# Patient Record
Sex: Female | Born: 1995 | Race: White | Hispanic: No | Marital: Married | Smoking: Never smoker
Health system: Southern US, Community
[De-identification: ages and names within clinical notes are randomized; demographics above are authoritative.]

## PROBLEM LIST (undated history)

## (undated) DIAGNOSIS — Z86718 Personal history of other venous thrombosis and embolism: Secondary | ICD-10-CM

## (undated) DIAGNOSIS — I498 Other specified cardiac arrhythmias: Secondary | ICD-10-CM

## (undated) DIAGNOSIS — I2699 Other pulmonary embolism without acute cor pulmonale: Secondary | ICD-10-CM

## (undated) DIAGNOSIS — Q51810 Arcuate uterus: Secondary | ICD-10-CM

## (undated) DIAGNOSIS — K831 Obstruction of bile duct: Secondary | ICD-10-CM

## (undated) DIAGNOSIS — G90A Postural orthostatic tachycardia syndrome (POTS): Secondary | ICD-10-CM

## (undated) DIAGNOSIS — O26649 Intrahepatic cholestasis of pregnancy, unspecified trimester: Secondary | ICD-10-CM

## (undated) DIAGNOSIS — Q231 Congenital insufficiency of aortic valve: Secondary | ICD-10-CM

## (undated) DIAGNOSIS — Q2381 Bicuspid aortic valve: Secondary | ICD-10-CM

## (undated) DIAGNOSIS — E538 Deficiency of other specified B group vitamins: Secondary | ICD-10-CM

## (undated) DIAGNOSIS — F988 Other specified behavioral and emotional disorders with onset usually occurring in childhood and adolescence: Secondary | ICD-10-CM

## (undated) HISTORY — DX: Deficiency of other specified B group vitamins: E53.8

## (undated) HISTORY — PX: WISDOM TOOTH EXTRACTION: SHX21

## (undated) HISTORY — DX: Obstruction of bile duct: K83.1

## (undated) HISTORY — DX: Intrahepatic cholestasis of pregnancy, unspecified trimester: O26.649

## (undated) HISTORY — DX: Bicuspid aortic valve: Q23.81

## (undated) HISTORY — DX: Congenital insufficiency of aortic valve: Q23.1

## (undated) HISTORY — DX: Arcuate uterus: Q51.810

## (undated) HISTORY — PX: TYMPANOSTOMY: SHX2586

## (undated) HISTORY — PX: WRIST SURGERY: SHX841

---
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
## 2014-02-11 DIAGNOSIS — F988 Other specified behavioral and emotional disorders with onset usually occurring in childhood and adolescence: Secondary | ICD-10-CM | POA: Insufficient documentation

## 2015-07-25 DIAGNOSIS — R000 Tachycardia, unspecified: Secondary | ICD-10-CM | POA: Insufficient documentation

## 2016-09-27 DIAGNOSIS — Z86718 Personal history of other venous thrombosis and embolism: Secondary | ICD-10-CM | POA: Insufficient documentation

## 2018-06-25 DIAGNOSIS — G90A Postural orthostatic tachycardia syndrome (POTS): Secondary | ICD-10-CM | POA: Insufficient documentation

## 2018-07-21 ENCOUNTER — Encounter (HOSPITAL_BASED_OUTPATIENT_CLINIC_OR_DEPARTMENT_OTHER): Payer: Self-pay | Admitting: *Deleted

## 2018-07-21 ENCOUNTER — Emergency Department (HOSPITAL_BASED_OUTPATIENT_CLINIC_OR_DEPARTMENT_OTHER)
Admission: EM | Admit: 2018-07-21 | Discharge: 2018-07-21 | Disposition: A | Discharge status: Home / Self Care | Payer: 59 | Attending: Emergency Medicine | Admitting: Emergency Medicine

## 2018-07-21 ENCOUNTER — Other Ambulatory Visit: Payer: Self-pay

## 2018-07-21 DIAGNOSIS — O26899 Other specified pregnancy related conditions, unspecified trimester: Secondary | ICD-10-CM | POA: Insufficient documentation

## 2018-07-21 DIAGNOSIS — Z7901 Long term (current) use of anticoagulants: Secondary | ICD-10-CM | POA: Diagnosis not present

## 2018-07-21 DIAGNOSIS — R0789 Other chest pain: Secondary | ICD-10-CM | POA: Insufficient documentation

## 2018-07-21 DIAGNOSIS — Z79899 Other long term (current) drug therapy: Secondary | ICD-10-CM | POA: Insufficient documentation

## 2018-07-21 HISTORY — DX: Other specified behavioral and emotional disorders with onset usually occurring in childhood and adolescence: F98.8

## 2018-07-21 HISTORY — DX: Postural orthostatic tachycardia syndrome (POTS): G90.A

## 2018-07-21 HISTORY — DX: Other specified cardiac arrhythmias: I49.8

## 2018-07-21 HISTORY — DX: Personal history of other venous thrombosis and embolism: Z86.718

## 2018-07-21 LAB — CBC WITH DIFFERENTIAL/PLATELET
Abs Immature Granulocytes: 0.01 10*3/uL (ref 0.00–0.07)
Basophils Absolute: 0 10*3/uL (ref 0.0–0.1)
Basophils Relative: 0 %
Eosinophils Absolute: 0.1 10*3/uL (ref 0.0–0.5)
Eosinophils Relative: 1 %
HCT: 36.7 % (ref 36.0–46.0)
Hemoglobin: 12.2 g/dL (ref 12.0–15.0)
Immature Granulocytes: 0 %
Lymphocytes Relative: 24 %
Lymphs Abs: 1.4 10*3/uL (ref 0.7–4.0)
MCH: 31.3 pg (ref 26.0–34.0)
MCHC: 33.2 g/dL (ref 30.0–36.0)
MCV: 94.1 fL (ref 80.0–100.0)
Monocytes Absolute: 0.6 10*3/uL (ref 0.1–1.0)
Monocytes Relative: 11 %
Neutro Abs: 3.6 10*3/uL (ref 1.7–7.7)
Neutrophils Relative %: 64 %
Platelets: 273 10*3/uL (ref 150–400)
RBC: 3.9 MIL/uL (ref 3.87–5.11)
RDW: 12.7 % (ref 11.5–15.5)
WBC: 5.7 10*3/uL (ref 4.0–10.5)
nRBC: 0 % (ref 0.0–0.2)

## 2018-07-21 LAB — COMPREHENSIVE METABOLIC PANEL
ALT: 46 U/L — ABNORMAL HIGH (ref 0–44)
AST: 40 U/L (ref 15–41)
Albumin: 4.1 g/dL (ref 3.5–5.0)
Alkaline Phosphatase: 55 U/L (ref 38–126)
Anion gap: 8 (ref 5–15)
BUN: 10 mg/dL (ref 6–20)
CO2: 22 mmol/L (ref 22–32)
Calcium: 9.1 mg/dL (ref 8.9–10.3)
Chloride: 105 mmol/L (ref 98–111)
Creatinine, Ser: 0.61 mg/dL (ref 0.44–1.00)
GFR calc Af Amer: >60 mL/min (ref >60)
GFR calc non Af Amer: >60 mL/min (ref >60)
Glucose, Bld: 94 mg/dL (ref 70–99)
Potassium: 3.4 mmol/L — ABNORMAL LOW (ref 3.5–5.1)
Sodium: 135 mmol/L (ref 135–145)
Total Bilirubin: 0.4 mg/dL (ref 0.3–1.2)
Total Protein: 6.7 g/dL (ref 6.5–8.1)

## 2018-07-21 LAB — HCG, QUANTITATIVE, PREGNANCY: hCG, Beta Chain, Quant, S: 38945 m[IU]/mL — ABNORMAL HIGH (ref <5)

## 2018-07-21 MED ORDER — ALUM & MAG HYDROXIDE-SIMETH 200-200-20 MG/5ML PO SUSP
30.0000 mL | Freq: Once | ORAL | Status: AC
  Administered 2018-07-21: 30 mL via ORAL
  Filled 2018-07-21: qty 30

## 2018-07-21 NOTE — Discharge Instructions (Addendum)
Your testing here today did not show any significant abnormalities.  Follow-up with your doctor.  Continue to use your Lovenox as this is an appropriate dose to prevent blood clots.

## 2018-07-21 NOTE — ED Provider Notes (Signed)
[facility] EMERGENCY DEPARTMENT Provider Note   CSN: [id number] Arrival date & time: 07/21/18  1944     History   Chief Complaint No chief complaint on file.   HPI Sabrina Riddle is a 23 y.o. female.     HPI Patient presents to the emergency department with chest pressure that started right after eating lunch.  The patient states that it spreads across her entire chest.  The patient states that she does have some shortness of breath when she ambulates.  Patient states she did see her cardiologist earlier today.  The patient states that she is taking her Lovenox as prescribed.  The patient denies  headache,blurred vision, neck pain, fever, cough, weakness, numbness, dizziness, anorexia, edema, abdominal pain, nausea, vomiting, diarrhea, rash, back pain, dysuria, hematemesis, bloody stool, near syncope, or syncope. Past Medical History:  Diagnosis Date  . ADD (attention deficit disorder)   . History of DVT (deep vein thrombosis)   . POTS (postural orthostatic tachycardia syndrome)   . Pulmonary embolism (HCC)     There are no active problems to display for this patient.   History reviewed. No pertinent surgical history.   OB History    Gravida  1   Para      Term      Preterm      AB      Living        SAB      TAB      Ectopic      Multiple      Live Births               Home Medications    Prior to Admission medications   Medication Sig Start Date End Date Taking? Authorizing Provider  enoxaparin (LOVENOX) 60 MG/0.6ML injection Inject 50 mg into the skin. 08/09/13  Yes [provider]  Prenatal Vit-Fe Fumarate-FA (PRENATAL VITAMINS PO) Take by mouth.   Yes [provider]  amphetamine-dextroamphetamine (ADDERALL XR) 25 MG 24 hr capsule Take 25 mg by mouth.    [provider]  enoxaparin (LOVENOX) 60 MG/0.6ML injection Inject 0.5 mLs (50 mg total) into the skin every 12 (twelve) hours. 08/09/13   Ward,  Layla MawKristen N, DO  norethindrone-ethinyl estradiol (CYCLAFEM,ALYACEN) 0.5/0.75/1-35 MG-MCG tablet Take by mouth.    [provider]  potassium chloride SA (K-DUR,KLOR-CON) 20 MEQ tablet Take 40 mEq by mouth. 08/09/13   [provider]  predniSONE (DELTASONE) 20 MG tablet Take 3 tablets (60 mg total) by mouth daily. 08/09/13   Ward, Layla MawKristen N, DO  sodium chloride 0.9 % infusion Inject 1,000 mLs into the vein. 08/08/13   [provider]  warfarin (COUMADIN) 5 MG tablet Take 1 tablet (5 mg total) by mouth daily. 08/09/13   Ward, Layla MawKristen N, DO    Family History No family history on file.  Social History Social History   Tobacco Use  . Smoking status: Never Smoker  . Smokeless tobacco: Never Used  Substance Use Topics  . Alcohol use: No  . Drug use: No     Allergies   Concerta [methylphenidate] and Penicillins   Review of Systems Review of Systems  All other systems negative except as documented in the HPI. All pertinent positives and negatives as reviewed in the HPI. Physical Exam Updated Vital Signs BP 138/80   Pulse (!) 107   Temp 98.6 F (37 C) (Oral)   Resp 20   Ht 5\' 4"  (1.626 m)  Wt 61.7 kg   LMP 06/07/2018   SpO2 100%   BMI 23.34 kg/m   Physical Exam Vitals signs and nursing note reviewed.  Constitutional:      General: She is not in acute distress.    Appearance: She is well-developed.  HENT:     Head: Normocephalic and atraumatic.  Eyes:     Pupils: Pupils are equal, round, and reactive to light.  Neck:     Musculoskeletal: Normal range of motion and neck supple.  Cardiovascular:     Rate and Rhythm: Normal rate and regular rhythm.     Heart sounds: Normal heart sounds. No murmur. No friction rub. No gallop.   Pulmonary:     Effort: Pulmonary effort is normal. No respiratory distress.     Breath sounds: Normal breath sounds. No stridor. No wheezing, rhonchi or rales.  Chest:     Chest wall: No tenderness.  Abdominal:      General: Bowel sounds are normal. There is no distension.     Palpations: Abdomen is soft.     Tenderness: There is no abdominal tenderness.  Skin:    General: Skin is warm and dry.     Capillary Refill: Capillary refill takes less than 2 seconds.     Findings: No erythema or rash.  Neurological:     Mental Status: She is alert and oriented to person, place, and time.     Motor: No abnormal muscle tone.     Coordination: Coordination normal.  Psychiatric:        Behavior: Behavior normal.      ED Treatments / Results  Labs (all labs ordered are listed, but only abnormal results are displayed) Labs Reviewed  COMPREHENSIVE METABOLIC PANEL - Abnormal; Notable for the following components:      Result Value   Potassium 3.4 (*)    ALT 46 (*)    All other components within normal limits  HCG, QUANTITATIVE, PREGNANCY - Abnormal; Notable for the following components:   hCG, Beta Chain, Sabrina Riddle, [place] 38,945 (*)    All other components within normal limits  CBC WITH DIFFERENTIAL/PLATELET    EKG EKG Interpretation  Date/Time:  Monday July 21 2018 20:02:26 EDT Ventricular Rate:  96 PR Interval:    QRS Duration: 86 QT Interval:  327 QTC Calculation: 414 R Axis:   94 Text Interpretation:  Sinus rhythm Borderline right axis deviation Borderline T abnormalities, anterior leads Baseline wander in lead(s) V3 V4 V5 No significant change since last tracing Confirmed by Blanchie Dessert [phone]) on 07/21/2018 8:34:24 PM   Radiology No results found.  Procedures Procedures (including critical care time)  Medications Ordered in ED Medications  alum & mag hydroxide-simeth (MAALOX/MYLANTA) 200-200-20 MG/5ML suspension 30 mL (has no administration in time range)     Initial Impression / Assessment and Plan / ED Course  I have reviewed the triage vital signs and the nursing notes.  Pertinent labs & imaging results that were available during my care of the patient were reviewed by me and  considered in my medical decision making (see chart for details).       Patient will be ambulated and check for desaturations and her pulse oximetry and see if she has any significant elevation in her heart rate.  At this point I do not feel that this is related to a pulmonary embolism she is taking her Lovenox as prescribed.  She states she is not missed any doses.  Patient is sitting comfortably in  the bed and her heart rates in the 80s on my examination.   Final Clinical Impressions(s) / ED Diagnoses   Final diagnoses:  None    ED Discharge Orders    None       Kyra MangesLawyer, Judine Arciniega, PA-C 07/21/18 2138    Gwyneth SproutPlunkett, Whitney, MD 07/22/18 2137

## 2018-07-21 NOTE — ED Notes (Signed)
02 SATS maintained 100% while ambulating.

## 2018-07-21 NOTE — ED Triage Notes (Signed)
Chest pain since after lunch today. She is [redacted] weeks pregnant. States she has a hx of PE's.

## 2018-07-21 NOTE — ED Notes (Signed)
Oxygen saturation remained 100% while ambulating.

## 2018-10-10 ENCOUNTER — Encounter: Payer: 59 | Admitting: Certified Nurse Midwife

## 2018-10-10 ENCOUNTER — Encounter: Payer: Self-pay | Admitting: Obstetrics & Gynecology

## 2018-10-10 DIAGNOSIS — O0990 Supervision of high risk pregnancy, unspecified, unspecified trimester: Secondary | ICD-10-CM | POA: Insufficient documentation

## 2018-10-13 ENCOUNTER — Ambulatory Visit (INDEPENDENT_AMBULATORY_CARE_PROVIDER_SITE_OTHER): Payer: 59 | Admitting: Obstetrics & Gynecology

## 2018-10-13 ENCOUNTER — Other Ambulatory Visit: Payer: Self-pay

## 2018-10-13 ENCOUNTER — Encounter: Payer: Self-pay | Admitting: Obstetrics & Gynecology

## 2018-10-13 ENCOUNTER — Other Ambulatory Visit (HOSPITAL_COMMUNITY)
Admission: RE | Admit: 2018-10-13 | Discharge: 2018-10-13 | Disposition: A | Discharge status: Home / Self Care | Payer: 59 | Source: Ambulatory Visit | Attending: Obstetrics & Gynecology | Admitting: Obstetrics & Gynecology

## 2018-10-13 DIAGNOSIS — O0990 Supervision of high risk pregnancy, unspecified, unspecified trimester: Secondary | ICD-10-CM | POA: Diagnosis not present

## 2018-10-13 DIAGNOSIS — O0992 Supervision of high risk pregnancy, unspecified, second trimester: Secondary | ICD-10-CM

## 2018-10-13 DIAGNOSIS — Z3A18 18 weeks gestation of pregnancy: Secondary | ICD-10-CM

## 2018-10-13 MED ORDER — BLOOD PRESSURE KIT DEVI: 1.0000 | 0 refills | Status: DC

## 2018-10-13 MED ORDER — BREAST PUMP MISC: 1.0000 | Freq: Every day | 0 refills | Status: DC

## 2018-10-13 NOTE — Addendum Note (Signed)
Addended by: Asencion Islam on: 10/13/2018 03:12 PM   Modules accepted: Orders

## 2018-10-13 NOTE — Progress Notes (Signed)
  Subjective:    Brynnly Bonet is being seen today for her first obstetrical visit.  This is a planned pregnancy. She took treatment with clomid and then femara and metformin.  She is at [redacted]w[redacted]d gestation. Her obstetrical history is significant for h/o DVT. Relationship with FOB: spouse, living together. Patient does intend to breast feed. Pregnancy history fully reviewed.  Patient reports no complaints.  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  Objective:     BP (!) 109/59   Pulse 93   Wt 148 lb (67.1 kg)   LMP 06/07/2018   BMI 25.40 kg/m  Physical Exam  Exam Breathing, conversing, and ambulating normally Heart- rrr Lungs- CTAB Well nourished, well hydrated White female, no apparent distress Spec exam reveals normal cervix and discharge   Assessment:    Pregnancy: G1P0 Patient Active Problem List   Diagnosis Date Noted  . Supervision of high risk pregnancy, antepartum 10/10/2018  . Postural orthostatic tachycardia syndrome 06/25/2018  . History of DVT (deep vein thrombosis) 09/27/2016  . Tachycardia 07/25/2015  . ADD (attention deficit disorder) 02/11/2014       Plan:     Initial labs drawn. Prenatal vitamins. Problem list reviewed and updated. AFP3 discussed: declined. Role of ultrasound in pregnancy discussed; fetal survey: ordered. Pap smear sent  She continues with daily lovenox injections Babyscripts and BP cuff ordered  Emily Filbert 10/13/2018

## 2018-10-13 NOTE — Addendum Note (Signed)
Addended by: Lyndal Rainbow on: 10/13/2018 10:49 AM   Modules accepted: Orders

## 2018-10-14 LAB — CYTOLOGY - PAP: Diagnosis: NEGATIVE

## 2018-10-21 ENCOUNTER — Ambulatory Visit (HOSPITAL_COMMUNITY)
Admission: RE | Admit: 2018-10-21 | Discharge: 2018-10-21 | Disposition: A | Discharge status: Home / Self Care | Payer: 59 | Source: Ambulatory Visit | Attending: Obstetrics and Gynecology | Admitting: Obstetrics and Gynecology

## 2018-10-21 ENCOUNTER — Other Ambulatory Visit: Payer: Self-pay

## 2018-10-21 ENCOUNTER — Other Ambulatory Visit: Payer: Self-pay | Admitting: Obstetrics & Gynecology

## 2018-10-21 ENCOUNTER — Other Ambulatory Visit (HOSPITAL_COMMUNITY): Payer: Self-pay | Admitting: *Deleted

## 2018-10-21 DIAGNOSIS — O0990 Supervision of high risk pregnancy, unspecified, unspecified trimester: Secondary | ICD-10-CM

## 2018-10-21 DIAGNOSIS — Z3A19 19 weeks gestation of pregnancy: Secondary | ICD-10-CM | POA: Diagnosis not present

## 2018-10-21 DIAGNOSIS — O0992 Supervision of high risk pregnancy, unspecified, second trimester: Secondary | ICD-10-CM

## 2018-10-21 DIAGNOSIS — Z86711 Personal history of pulmonary embolism: Secondary | ICD-10-CM | POA: Diagnosis not present

## 2018-10-21 DIAGNOSIS — Z86718 Personal history of other venous thrombosis and embolism: Secondary | ICD-10-CM

## 2018-10-21 DIAGNOSIS — O2232 Deep phlebothrombosis in pregnancy, second trimester: Secondary | ICD-10-CM

## 2018-11-10 ENCOUNTER — Other Ambulatory Visit: Payer: Self-pay

## 2018-11-10 ENCOUNTER — Telehealth (INDEPENDENT_AMBULATORY_CARE_PROVIDER_SITE_OTHER): Payer: 59 | Admitting: Obstetrics & Gynecology

## 2018-11-10 VITALS — Wt 155.0 lb

## 2018-11-10 DIAGNOSIS — O0990 Supervision of high risk pregnancy, unspecified, unspecified trimester: Secondary | ICD-10-CM

## 2018-11-10 DIAGNOSIS — O0992 Supervision of high risk pregnancy, unspecified, second trimester: Secondary | ICD-10-CM

## 2018-11-10 DIAGNOSIS — Z3A22 22 weeks gestation of pregnancy: Secondary | ICD-10-CM

## 2018-11-10 DIAGNOSIS — Z86718 Personal history of other venous thrombosis and embolism: Secondary | ICD-10-CM

## 2018-11-10 NOTE — Progress Notes (Signed)
TELEHEALTH OBSTETRICS PRENATAL VIRTUAL VIDEO VISIT ENCOUNTER NOTE  Provider location: Center for [facility] [place]   I connected with [name] on 11/10/18 at  8:45 AM EDT by MyChart Video Encounter at home and verified that I am speaking with the correct person using two identifiers.   I discussed the limitations, risks, security and privacy concerns of performing an evaluation and management service virtually and the availability of in person appointments. I also discussed with the patient that there may be a patient responsible charge related to this service. The patient expressed understanding and agreed to proceed. Subjective:  Sabrina Riddle is a 23 y.o. G1P0 at [redacted]w[redacted]d being seen today for ongoing prenatal care.  She is currently monitored for the following issues for this high-risk pregnancy and has Supervision of high risk pregnancy, antepartum; ADD (attention deficit disorder); History of DVT (deep vein thrombosis); Postural orthostatic tachycardia syndrome; and Tachycardia on their problem list.  Patient reports no complaints except some swelling in her feet.  Contractions: Not present. Vag. Bleeding: None.  Movement: Present. Denies any leaking of fluid.   The following portions of the patient's history were reviewed and updated as appropriate: allergies, current medications, past family history, past medical history, past social history, past surgical history and problem list.   Objective:  There were no vitals filed for this visit.  Fetal Status:     Movement: Present     General:  Alert, oriented and cooperative. Patient is in no acute distress.  Respiratory: Normal respiratory effort, no problems with respiration noted  Mental Status: Normal mood and affect. Normal behavior. Normal judgment and thought content.  Rest of physical exam deferred due to type of encounter  Imaging: [place] Mfm Ob Detail +14 Wk  Result Date: 10/21/2018  ----------------------------------------------------------------------  OBSTETRICS REPORT                       (Signed Final 10/21/2018 05:18 pm) ---------------------------------------------------------------------- Patient Info  ID #:       [id number]                          D.O.B.:  19-Oct-1995 (23 yrs)  Name:       Sabrina Riddle                Visit Date: 10/21/2018 11:36 am ---------------------------------------------------------------------- Performed By  Performed By:     Birdena Crandall        Ref. Address:     [address]                                                             [place], [place]  [place]  Attending:        Johnell Comings MD         [facility]  Referred By:      Emily Filbert MD ---------------------------------------------------------------------- Orders   #  Description                          Code         Ordered By   1  [place] MFM OB DETAIL +14 [place]              10258.52     Sheppard Luckenbach  ----------------------------------------------------------------------   #  Order #                    Accession #                 Episode #   1  [id number]                  [id number]                  [id number]  ---------------------------------------------------------------------- Indications   Deep vein thrombosis (DVT)                     O22.30   High risk pregnancy (postural orthostatic      O09.90   tachycardia synd)   History of PE during pregnancy                 Z86.711   [redacted] weeks gestation of pregnancy                Z3A.19  ---------------------------------------------------------------------- Vital Signs  Weight (lb): 148                               Height:        5'4"  BMI:         25.4  ---------------------------------------------------------------------- Fetal Evaluation  Num Of Fetuses:         1  Fetal Heart Rate(bpm):  143  Cardiac Activity:       Observed  Presentation:           Cephalic  Placenta:               Posterior  P. Cord Insertion:      Visualized, central  Amniotic Fluid  AFI FV:      Within normal limits                              Largest Pocket(cm)                              4.68  Comment:    No placental abruption or previa identified. ---------------------------------------------------------------------- Biometry  BPD:      43.8  mm     G. Age:  19w  2d                  CI:        69.16   %                                                          FL/HC:      16.8   %  HC:      168.2  mm     G. Age:  19w 3d                  HC/AC:      1.20  AC:      140.6  mm     G. Age:  19w 3d                  FL/BPD:     64.4   %  FL:       28.2  mm     G. Age:  18w 4d                  FL/AC:      20.1   %  HUM:      26.6  mm     G. Age:  18w 3d  CER:        20  mm     G. Age:  19w 0d  NFT:       3.2  mm  CM:          4  mm  Est. FW:     275  gm    0 lb 10 oz ---------------------------------------------------------------------- OB History  Gravidity:    1 ---------------------------------------------------------------------- Gestational Age  LMP:           19w 3d        Date:  06/07/18                 EDD:   03/14/19  U/S Today:     19w 1d                                        EDD:   03/16/19 ---------------------------------------------------------------------- Anatomy  Cranium:               Appears normal         LVOT:                   Appears normal  Cavum:                 Appears normal         Aortic Arch:            Appears normal  Ventricles:            Appears normal         Ductal Arch:            Appears normal  Choroid Plexus:        Appears normal         Diaphragm:              Appears normal  Cerebellum:  Appears normal         Stomach:                Appears  normal, left                                                                        sided  Posterior Fossa:       Appears normal         Abdomen:                Appears normal  Nuchal Fold:           Appears normal         Abdominal Wall:         Appears nml (cord                                                                        insert, abd wall)  Face:                  Appears normal         Cord Vessels:           Appears normal (3                         (orbits and profile)                           vessel cord)  Lips:                  Appears normal         Kidneys:                Appear normal  Palate:                Appears normal         Bladder:                Appears normal  Thoracic:              Appears normal         Spine:                  Appears normal  Heart:                 Appears normal         Upper Extremities:      Appears normal                         (4CH, axis, and                         situs)  RVOT:                  Appears normal  Lower Extremities:      Appears normal  Other:  Fetus appears to be female. Nasal bone visualized. Heels and 5th          digit previously seen. ---------------------------------------------------------------------- Cervix Uterus Adnexa  Cervix  Length:            3.2  cm.  Normal appearance by transabdominal scan.  Uterus  No abnormality visualized.  Left Ovary  No adnexal mass visualized.  Right Ovary  No adnexal mass visualized.  Cul De Sac  No free fluid seen.  Adnexa  No adnexal mass visualized. ---------------------------------------------------------------------- Comments  This patient was seen for a detailed fetal anatomy scan due  to a history of prior pulmonary embolism.  The patient reports  that she developed a pulmonary embolus while taking oral  contraceptives a few years ago.  Due to her history of a prior  thromboembolic event, she is currently treated with a  prophylactic dose of Lovenox 40 mg daily.  The patient has declined all  screening tests for fetal  aneuploidy in her current pregnancy.  She was informed that the fetal growth and amniotic fluid  level were appropriate for her gestational age.  There were no obvious fetal anomalies noted on today's  ultrasound exam.  The patient was informed that anomalies may be missed due  to technical limitations. If the fetus is in a suboptimal position  or maternal habitus is increased, visualization of the fetus in  the maternal uterus may be impaired.  Due to her history of a prior thromboembolic event, we will  continue to follow her with serial growth ultrasounds.  In  regards to the management of Lovenox in pregnancy, the  patient may either be switched to twice daily prophylactic  subcu heparin starting at around 35 weeks or she may  continue the daily prophylactic Lovenox until the day prior to  her scheduled delivery date.  The patient was advised that  she would have to be off of prophylactic anticoagulation for  12 hours prior to receiving regional anesthesia.  A follow-up exam was scheduled in 5 weeks. ----------------------------------------------------------------------                   Ma RingsVictor Fang, MD Electronically Signed Final Report   10/21/2018 05:18 pm ----------------------------------------------------------------------   Assessment and Plan:  Pregnancy: G1P0 at 6048w2d 1. Supervision of high risk pregnancy, antepartum - 2 hour GTT at next visit - she will come by this Friday to pick up her BP cuff here  2. History of DVT (deep vein thrombosis) - on levenox 40 mg daily  Preterm labor symptoms and general obstetric precautions including but not limited to vaginal bleeding, contractions, leaking of fluid and fetal movement were reviewed in detail with the patient. I discussed the assessment and treatment plan with the patient. The patient was provided an opportunity to ask questions and all were answered. The patient agreed with the plan and demonstrated an  understanding of the instructions. The patient was advised to call back or seek an in-person office evaluation/[facility] for any urgent or concerning symptoms. Please refer to After Visit Summary for other counseling recommendations.   I provided 10 minutes of face-to-face time during this encounter.  No follow-ups on file.  Future Appointments  Date Time Provider Department Center  11/18/2018  8:00 AM [facility]-M[facility] NURSE WH-MFC MFC-US  11/18/2018  8:00 AM [facility]-M[facility] US 3 WH-MFCUS MFC-US  12/15/2018  8:15 AM Elsie LincolnLeggett, [place]  Rexene Edison, MD [facility]    Allie Bossier, MD Center for [employer], [facility]

## 2018-11-10 NOTE — Progress Notes (Signed)
Pt concerned about swelling in her feet  I connected with  Kathrin Greathouse on 11/10/18 at  8:45 AM EDT by telephone and verified that I am speaking with the correct person using two identifiers.   I discussed the limitations, risks, security and privacy concerns of performing an evaluation and management service by telephone and the availability of in person appointments. I also discussed with the patient that there may be a patient responsible charge related to this service. The patient expressed understanding and agreed to proceed.  Crosby Oyster, RN 11/10/2018  8:51 AM

## 2018-11-18 ENCOUNTER — Encounter (HOSPITAL_COMMUNITY): Payer: Self-pay

## 2018-11-18 ENCOUNTER — Other Ambulatory Visit (HOSPITAL_COMMUNITY): Payer: Self-pay | Admitting: *Deleted

## 2018-11-18 ENCOUNTER — Other Ambulatory Visit: Payer: Self-pay

## 2018-11-18 ENCOUNTER — Ambulatory Visit (HOSPITAL_COMMUNITY)
Admission: RE | Admit: 2018-11-18 | Discharge: 2018-11-18 | Disposition: A | Discharge status: Home / Self Care | Payer: 59 | Source: Ambulatory Visit | Attending: Obstetrics and Gynecology | Admitting: Obstetrics and Gynecology

## 2018-11-18 ENCOUNTER — Ambulatory Visit (HOSPITAL_COMMUNITY): Payer: 59 | Admitting: *Deleted

## 2018-11-18 DIAGNOSIS — Z3689 Encounter for other specified antenatal screening: Secondary | ICD-10-CM

## 2018-11-18 DIAGNOSIS — Z86718 Personal history of other venous thrombosis and embolism: Secondary | ICD-10-CM | POA: Diagnosis present

## 2018-11-18 DIAGNOSIS — Z362 Encounter for other antenatal screening follow-up: Secondary | ICD-10-CM

## 2018-11-18 DIAGNOSIS — Z3A23 23 weeks gestation of pregnancy: Secondary | ICD-10-CM

## 2018-11-18 DIAGNOSIS — Z8759 Personal history of other complications of pregnancy, childbirth and the puerperium: Secondary | ICD-10-CM | POA: Diagnosis present

## 2018-11-18 DIAGNOSIS — O0990 Supervision of high risk pregnancy, unspecified, unspecified trimester: Secondary | ICD-10-CM

## 2018-11-18 DIAGNOSIS — O2232 Deep phlebothrombosis in pregnancy, second trimester: Secondary | ICD-10-CM

## 2018-11-18 DIAGNOSIS — O0992 Supervision of high risk pregnancy, unspecified, second trimester: Secondary | ICD-10-CM | POA: Diagnosis not present

## 2018-12-04 ENCOUNTER — Telehealth: Payer: Self-pay | Admitting: *Deleted

## 2018-12-04 NOTE — Telephone Encounter (Signed)
Pt called stating that after she ate supper last night she "went blind" in her Lt eye for a few seconds.  She said then she had a slight headache.  She stated that the headache wasn't bad enough to even take any meds.  She thought maybe her BS dropped.  I told her that since she had just finish dinner that was probably not the case.  She did take her BP and is was 105/56.  She has had no further symptoms since.  I offered her any appt with MD tomorrow morning but told her that if she had any further episode to go straight to [facility] for evaluation.  Pt voices understanding.

## 2018-12-05 ENCOUNTER — Other Ambulatory Visit: Payer: Self-pay

## 2018-12-05 ENCOUNTER — Ambulatory Visit (INDEPENDENT_AMBULATORY_CARE_PROVIDER_SITE_OTHER): Payer: 59 | Admitting: Obstetrics & Gynecology

## 2018-12-05 VITALS — BP 103/60 | HR 99 | Temp 98.2°F | Wt 164.0 lb

## 2018-12-05 DIAGNOSIS — Z3A25 25 weeks gestation of pregnancy: Secondary | ICD-10-CM

## 2018-12-05 DIAGNOSIS — O2602 Excessive weight gain in pregnancy, second trimester: Secondary | ICD-10-CM

## 2018-12-05 DIAGNOSIS — O0990 Supervision of high risk pregnancy, unspecified, unspecified trimester: Secondary | ICD-10-CM

## 2018-12-05 DIAGNOSIS — O0992 Supervision of high risk pregnancy, unspecified, second trimester: Secondary | ICD-10-CM

## 2018-12-05 DIAGNOSIS — Z86718 Personal history of other venous thrombosis and embolism: Secondary | ICD-10-CM

## 2018-12-05 NOTE — Progress Notes (Signed)
Had a episode with Left eye 2 days ago where everything went black.  BP was normal per pt.

## 2018-12-05 NOTE — Progress Notes (Signed)
   PRENATAL VISIT NOTE  Subjective:  Sabrina Riddle is a 23 y.o. G1P0 at [redacted]w[redacted]d being seen today for ongoing prenatal care.  She is currently monitored for the following issues for this high-risk pregnancy and has Supervision of high risk pregnancy, antepartum; ADD (attention deficit disorder); History of DVT (deep vein thrombosis); Postural orthostatic tachycardia syndrome; and Tachycardia on their problem list.  Patient reports several days ago she had a 45 minute episode of blurry vision and what sounds like nystagmus. It has not recurred since. She reports that she has had an eye exam this year. .  Contractions: Not present. Vag. Bleeding: None.  Movement: Present. Denies leaking of fluid.   The following portions of the patient's history were reviewed and updated as appropriate: allergies, current medications, past family history, past medical history, past social history, past surgical history and problem list.   Objective:   Vitals:   12/05/18 0820  BP: 103/60  Pulse: 99  Temp: 98.2 F (36.8 C)  Weight: 164 lb (74.4 kg)    Fetal Status:     Movement: Present     General:  Alert, oriented and cooperative. Patient is in no acute distress.  Skin: Skin is warm and dry. No rash noted.   Cardiovascular: Normal heart rate noted  Respiratory: Normal respiratory effort, no problems with respiration noted  Abdomen: Soft, gravid, appropriate for gestational age.  Pain/Pressure: Absent     Pelvic: Cervical exam deferred        Extremities: Normal range of motion.  Edema: Trace  Mental Status: Normal mood and affect. Normal behavior. Normal judgment and thought content.   Assessment and Plan:  Pregnancy: G1P0 at [redacted]w[redacted]d 1. History of DVT (deep vein thrombosis) - on  lovenox  2. Supervision of high risk pregnancy, antepartum - MFM follow up u/s next month  3. Excessive weight gain in the second trimester- we have discussed exercise and diet. She is happy to see a nutritionist.    Preterm labor symptoms and general obstetric precautions including but not limited to vaginal bleeding, contractions, leaking of fluid and fetal movement were reviewed in detail with the patient. Please refer to After Visit Summary for other counseling recommendations.   Return in about 3 weeks (around 12/26/2018) for 2 hour GTT.  Future Appointments  Date Time Provider [place]  12/15/2018  8:15 AM Guss Bunde, MD [facility]  12/23/2018 10:15 AM [place] MFC-US  12/23/2018 10:15 AM [facility] [place] 4 WH-MFCUS MFC-US    Emily Filbert, MD

## 2018-12-15 ENCOUNTER — Ambulatory Visit (INDEPENDENT_AMBULATORY_CARE_PROVIDER_SITE_OTHER): Payer: 59 | Admitting: Obstetrics & Gynecology

## 2018-12-15 ENCOUNTER — Encounter: Payer: Self-pay | Admitting: *Deleted

## 2018-12-15 ENCOUNTER — Other Ambulatory Visit: Payer: Self-pay

## 2018-12-15 VITALS — BP 103/62 | HR 89 | Temp 98.1°F | Wt 165.0 lb

## 2018-12-15 DIAGNOSIS — O0990 Supervision of high risk pregnancy, unspecified, unspecified trimester: Secondary | ICD-10-CM

## 2018-12-15 DIAGNOSIS — O26892 Other specified pregnancy related conditions, second trimester: Secondary | ICD-10-CM

## 2018-12-15 DIAGNOSIS — Z86718 Personal history of other venous thrombosis and embolism: Secondary | ICD-10-CM

## 2018-12-15 DIAGNOSIS — O26899 Other specified pregnancy related conditions, unspecified trimester: Secondary | ICD-10-CM | POA: Insufficient documentation

## 2018-12-15 DIAGNOSIS — O360120 Maternal care for anti-D [Rh] antibodies, second trimester, not applicable or unspecified: Secondary | ICD-10-CM

## 2018-12-15 DIAGNOSIS — L299 Pruritus, unspecified: Secondary | ICD-10-CM

## 2018-12-15 DIAGNOSIS — O99712 Diseases of the skin and subcutaneous tissue complicating pregnancy, second trimester: Secondary | ICD-10-CM

## 2018-12-15 DIAGNOSIS — O0992 Supervision of high risk pregnancy, unspecified, second trimester: Secondary | ICD-10-CM

## 2018-12-15 DIAGNOSIS — O99013 Anemia complicating pregnancy, third trimester: Secondary | ICD-10-CM

## 2018-12-15 DIAGNOSIS — O99012 Anemia complicating pregnancy, second trimester: Secondary | ICD-10-CM

## 2018-12-15 DIAGNOSIS — Z6791 Unspecified blood type, Rh negative: Secondary | ICD-10-CM | POA: Insufficient documentation

## 2018-12-15 DIAGNOSIS — Z3A27 27 weeks gestation of pregnancy: Secondary | ICD-10-CM

## 2018-12-15 LAB — POCT URINALYSIS DIPSTICK
Bilirubin, UA: NEGATIVE
Blood, UA: NEGATIVE
Glucose, UA: NEGATIVE
Ketones, UA: NEGATIVE
Leukocytes, UA: NEGATIVE
Nitrite, UA: NEGATIVE
Protein, UA: NEGATIVE
Spec Grav, UA: 1.01 (ref 1.010–1.025)
Urobilinogen, UA: 0.2 E.U./dL
pH, UA: 7 (ref 5.0–8.0)

## 2018-12-15 MED ORDER — RHO D IMMUNE GLOBULIN 1500 UNIT/2ML IJ SOSY
300.0000 ug | PREFILLED_SYRINGE | Freq: Once | INTRAMUSCULAR | Status: AC
  Administered 2018-12-15: 300 ug via INTRAMUSCULAR

## 2018-12-15 NOTE — Patient Instructions (Signed)
Return to office for any scheduled appointments. Call the office or go to the [facility] [place] if:  You begin to have strong, frequent contractions  Your water breaks.  Sometimes it is a big gush of fluid, sometimes it is just a trickle that keeps getting your panties wet or running down your legs  You have vaginal bleeding.  It is normal to have a small amount of spotting if your cervix was checked.   You do not feel your baby moving like normal.  If you do not, get something to eat and drink and lay down and focus on feeling your baby move.   If your baby is still not moving like normal, you should call the office or go to MAU.  Any other obstetric concerns.   

## 2018-12-15 NOTE — Progress Notes (Signed)
   PRENATAL VISIT NOTE  Subjective:  Sabrina Riddle is a 23 y.o. G1P0 at [redacted]w[redacted]d being seen today for ongoing prenatal care.  She is currently monitored for the following issues for this high-risk pregnancy and has Supervision of high risk pregnancy, antepartum; ADD (attention deficit disorder); History of DVT (deep vein thrombosis); Postural orthostatic tachycardia syndrome; Tachycardia; and Rh negative state in antepartum period on their problem list.  Patient reports itching around her abdomen for about a week. Uses cocoa butter but it does not help much. No itching noted elsewhere, not on palms or soles.  Contractions: Not present. Vag. Bleeding: None.  Movement: Present. Denies leaking of fluid.   The following portions of the patient's history were reviewed and updated as appropriate: allergies, current medications, past family history, past medical history, past social history, past surgical history and problem list.   Objective:   Vitals:   12/15/18 0826  BP: 103/62  Pulse: 89  Temp: 98.1 F (36.7 C)  Weight: 165 lb (74.8 kg)    Fetal Status: Fetal Heart Rate (bpm): 141 Fundal Height: 27 cm Movement: Present     General:  Alert, oriented and cooperative. Patient is in no acute distress.  Skin: Skin is warm and dry. No rash noted.   Cardiovascular: Normal heart rate noted  Respiratory: Normal respiratory effort, no problems with respiration noted  Abdomen: Soft, gravid, appropriate for gestational age.  Pain/Pressure: Absent     Pelvic: Cervical exam deferred        Extremities: Normal range of motion.  Edema: None  Mental Status: Normal mood and affect. Normal behavior. Normal judgment and thought content.   Assessment and Plan:  Pregnancy: G1P0 at [redacted]w[redacted]d 1. Pruritus of pregnancy in second trimester Likely due to abdominal skin stretching over gravid uterus. Recommended further topical agents that may help.  Will also check labs to evaluate for cholestasis (low index of  suspicion). - Bile acids - CMP  2. History of DVT (deep vein thrombosis) Continue Lovenox. Discussed Dr. Fritz Pickerel recommendation of switching to Heparin later in pregnancy; advised her that there was no critical need to do so.  Discussed timing of epidural placement with Lovenox and Heparin. Will discuss further later, patient will decide if she wants to switch or not.   3. Rh negative state in antepartum period Rhogam given today - rho (d) immune globulin (RHIG/RHOPHYLAC) injection 300 mcg - Antibody screen  4. Supervision of high risk pregnancy, antepartum Third trimester labs today, Tdap and Flu vaccines declined. - 2 Hour GTT - CBC - HIV antibody (with reflex) - RPR - POCT Urinalysis Dipstick Discussed weight gain in pregnancy thus far. Recommended healthy eating and moderate exercise.  Patient was told not to actively try to lose weight during pregnancy, she thought this was the recommendation. Will continue to monitor.  Preterm labor symptoms and general obstetric precautions including but not limited to vaginal bleeding, contractions, leaking of fluid and fetal movement were reviewed in detail with the patient. Please refer to After Visit Summary for other counseling recommendations.   Return in about 3 weeks (around 01/05/2019) for Virtual OB Visit.  Future Appointments  Date Time Provider [place]  12/23/2018 10:15 AM [place] NURSE [place] MFC-US  12/23/2018 10:15 AM [place] 4 WH-MFCUS MFC-US  12/24/2018  4:00 PM [place]  12/30/2018  1:30 PM Lajean Manes, CNM [facility]  01/13/2019  9:00 AM Lajean Manes, CNM [facility]    Verita Schneiders, MD

## 2018-12-16 ENCOUNTER — Encounter: Payer: Self-pay | Admitting: Obstetrics & Gynecology

## 2018-12-16 ENCOUNTER — Encounter: Payer: Self-pay | Admitting: *Deleted

## 2018-12-16 ENCOUNTER — Other Ambulatory Visit: Payer: Self-pay | Admitting: *Deleted

## 2018-12-16 ENCOUNTER — Telehealth: Payer: Self-pay

## 2018-12-16 DIAGNOSIS — O99013 Anemia complicating pregnancy, third trimester: Secondary | ICD-10-CM | POA: Insufficient documentation

## 2018-12-16 LAB — 2HR GTT W 1 HR, CARPENTER, 75 G
Glucose, 1 Hr, Gest: 135 mg/dL (ref 65–179)
Glucose, 2 Hr, Gest: 122 mg/dL (ref 65–152)
Glucose, Fasting, Gest: 77 mg/dL (ref 65–91)

## 2018-12-16 MED ORDER — DOCUSATE SODIUM 100 MG PO CAPS: 100.0000 mg | ORAL_CAPSULE | Freq: Two times a day (BID) | ORAL | 2 refills | Status: DC | PRN

## 2018-12-16 MED ORDER — FERROUS SULFATE 325 (65 FE) MG PO TABS: 325.0000 mg | ORAL_TABLET | Freq: Two times a day (BID) | ORAL | 1 refills | Status: DC

## 2018-12-16 MED ORDER — HYDROXYZINE HCL 10 MG PO TABS: 10.0000 mg | ORAL_TABLET | Freq: Three times a day (TID) | ORAL | 0 refills | Status: DC | PRN

## 2018-12-16 NOTE — Telephone Encounter (Signed)
error 

## 2018-12-16 NOTE — Progress Notes (Signed)
Pt called office stating that she now has the rash on her face and back.  Bile salts were ordered yesterday and are pending.  She did take Benadryl last night but it was expired.  She is to throw the expired med out.

## 2018-12-16 NOTE — Addendum Note (Signed)
Addended by: Verita Schneiders A on: 12/16/2018 10:33 AM   Modules accepted: Orders

## 2018-12-16 NOTE — Addendum Note (Signed)
Addended by: Verita Schneiders A on: 12/16/2018 10:55 AM   Modules accepted: Orders

## 2018-12-17 NOTE — Progress Notes (Signed)
f °

## 2018-12-18 LAB — COMPREHENSIVE METABOLIC PANEL
AG Ratio: 1.5 (calc) (ref 1.0–2.5)
ALT: 8 U/L (ref 6–29)
AST: 12 U/L (ref 10–30)
Albumin: 3.7 g/dL (ref 3.6–5.1)
Alkaline phosphatase (APISO): 66 U/L (ref 31–125)
BUN: 8 mg/dL (ref 7–25)
CO2: 24 mmol/L (ref 20–32)
Calcium: 9.2 mg/dL (ref 8.6–10.2)
Chloride: 104 mmol/L (ref 98–110)
Creat: 0.63 mg/dL (ref 0.50–1.10)
Globulin: 2.4 g/dL (calc) (ref 1.9–3.7)
Glucose, Bld: 79 mg/dL (ref 65–99)
Potassium: 4.3 mmol/L (ref 3.5–5.3)
Sodium: 138 mmol/L (ref 135–146)
Total Bilirubin: 0.3 mg/dL (ref 0.2–1.2)
Total Protein: 6.1 g/dL (ref 6.1–8.1)

## 2018-12-18 LAB — CBC
HCT: 32.6 % — ABNORMAL LOW (ref 35.0–45.0)
Hemoglobin: 10.9 g/dL — ABNORMAL LOW (ref 11.7–15.5)
MCH: 31.5 pg (ref 27.0–33.0)
MCHC: 33.4 g/dL (ref 32.0–36.0)
MCV: 94.2 fL (ref 80.0–100.0)
MPV: 10.7 fL (ref 7.5–12.5)
Platelets: 268 10*3/uL (ref 140–400)
RBC: 3.46 10*6/uL — ABNORMAL LOW (ref 3.80–5.10)
RDW: 12 % (ref 11.0–15.0)
WBC: 6.9 10*3/uL (ref 3.8–10.8)

## 2018-12-18 LAB — HIV ANTIBODY (ROUTINE TESTING W REFLEX): HIV 1&2 Ab, 4th Generation: NONREACTIVE

## 2018-12-18 LAB — ANTIBODY SCREEN: Antibody Screen: NOT DETECTED

## 2018-12-18 LAB — RPR: RPR Ser Ql: NONREACTIVE

## 2018-12-18 LAB — BILE ACIDS, TOTAL: Bile Acids Total: 13 umol/L (ref 0–19)

## 2018-12-20 ENCOUNTER — Other Ambulatory Visit: Payer: Self-pay | Admitting: Obstetrics and Gynecology

## 2018-12-20 DIAGNOSIS — O26649 Intrahepatic cholestasis of pregnancy, unspecified trimester: Secondary | ICD-10-CM | POA: Insufficient documentation

## 2018-12-20 DIAGNOSIS — K831 Obstruction of bile duct: Secondary | ICD-10-CM | POA: Insufficient documentation

## 2018-12-20 DIAGNOSIS — O26619 Liver and biliary tract disorders in pregnancy, unspecified trimester: Secondary | ICD-10-CM

## 2018-12-20 MED ORDER — URSODIOL 500 MG PO TABS: 500.0000 mg | ORAL_TABLET | Freq: Two times a day (BID) | ORAL | 3 refills | Status: DC

## 2018-12-22 ENCOUNTER — Ambulatory Visit (INDEPENDENT_AMBULATORY_CARE_PROVIDER_SITE_OTHER): Payer: 59 | Admitting: Obstetrics & Gynecology

## 2018-12-22 ENCOUNTER — Other Ambulatory Visit: Payer: Self-pay

## 2018-12-22 VITALS — BP 114/62 | HR 86 | Temp 98.1°F | Wt 166.0 lb

## 2018-12-22 DIAGNOSIS — K831 Obstruction of bile duct: Secondary | ICD-10-CM

## 2018-12-22 DIAGNOSIS — O26613 Liver and biliary tract disorders in pregnancy, third trimester: Secondary | ICD-10-CM

## 2018-12-22 DIAGNOSIS — Z86718 Personal history of other venous thrombosis and embolism: Secondary | ICD-10-CM

## 2018-12-22 DIAGNOSIS — Z3A28 28 weeks gestation of pregnancy: Secondary | ICD-10-CM

## 2018-12-22 MED ORDER — OMEPRAZOLE 20 MG PO CPDR: 20.0000 mg | DELAYED_RELEASE_CAPSULE | Freq: Every day | ORAL | 3 refills | Status: DC

## 2018-12-22 NOTE — Progress Notes (Signed)
   PRENATAL VISIT NOTE  Subjective:  Sabrina Riddle is a 23 y.o. G1P0 at [redacted]w[redacted]d being seen today for ongoing prenatal care.  She is currently monitored for the following issues for this high-risk pregnancy and has Supervision of high risk pregnancy, antepartum; ADD (attention deficit disorder); History of DVT (deep vein thrombosis); Postural orthostatic tachycardia syndrome; Tachycardia; Rh negative state in antepartum period; Anemia affecting pregnancy in third trimester; and Cholestasis during pregnancy, antepartum on their problem list.  Patient reports heartburn, nausea and purititis of trunk.  Contractions: Not present. Vag. Bleeding: None.  Movement: Present. Denies leaking of fluid.   The following portions of the patient's history were reviewed and updated as appropriate: allergies, current medications, past family history, past medical history, past social history, past surgical history and problem list.   Objective:   Vitals:   12/22/18 0921  BP: 114/62  Pulse: 86  Temp: 98.1 F (36.7 C)  Weight: 166 lb (75.3 kg)    Fetal Status: Fetal Heart Rate (bpm): 144   Movement: Present     General:  Alert, oriented and cooperative. Patient is in no acute distress.  Skin: Skin is warm and dry. No rash noted.   Cardiovascular: Normal heart rate noted  Respiratory: Normal respiratory effort, no problems with respiration noted  Abdomen: Soft, gravid, appropriate for gestational age.  Pain/Pressure: Absent     Pelvic: Cervical exam deferred        Extremities: Normal range of motion.  Edema: None  Mental Status: Normal mood and affect. Normal behavior. Normal judgment and thought content.   Assessment and Plan:  Pregnancy: G1P0 at [redacted]w[redacted]d 1. History of DVT (deep vein thrombosis) Continue Lovenox.  Pt does not intend to switch to heparin  2. Cholestasis during pregnancy, antepartum Continue Actigall; PPI for heartburn, pt will let us know if she wants something for nausea. SpPer  [facility] antepartum guidelines will tart BPP weekly and induction at 37 weeks.  Growth q 4 weeks with MFM.  Discussed monitoring fetal m  3.  Rh negative--received rhogam on 12/15/18  Preterm labor symptoms and general obstetric precautions including but not limited to vaginal bleeding, contractions, leaking of fluid and fetal movement were reviewed in detail with the patient. Please refer to After Visit Summary for other counseling recommendations.   Pt can cancel next weeks appt with midwife.  RTC in 2 weeks.  Continue BP monitoring at home.  Future Appointments  Date Time Provider [place]  12/23/2018 10:15 AM [place] NURSE [place] MFC-US  12/23/2018 10:15 AM [place] 4 WH-MFCUS MFC-US  12/24/2018  4:00 PM WOC-EDUCATION [facility] WOC  12/30/2018  1:30 PM Lajean Manes, CNM [facility]  01/13/2019  9:00 AM Lajean Manes, CNM [facility]    Silas [place], MD

## 2018-12-23 ENCOUNTER — Ambulatory Visit (HOSPITAL_COMMUNITY): Payer: 59 | Admitting: *Deleted

## 2018-12-23 ENCOUNTER — Encounter (HOSPITAL_COMMUNITY): Payer: Self-pay

## 2018-12-23 ENCOUNTER — Telehealth: Payer: Self-pay | Admitting: *Deleted

## 2018-12-23 ENCOUNTER — Ambulatory Visit (HOSPITAL_COMMUNITY)
Admission: RE | Admit: 2018-12-23 | Discharge: 2018-12-23 | Disposition: A | Discharge status: Home / Self Care | Payer: 59 | Source: Ambulatory Visit | Attending: Obstetrics and Gynecology | Admitting: Obstetrics and Gynecology

## 2018-12-23 ENCOUNTER — Other Ambulatory Visit (HOSPITAL_COMMUNITY): Payer: Self-pay | Admitting: *Deleted

## 2018-12-23 DIAGNOSIS — Z3A28 28 weeks gestation of pregnancy: Secondary | ICD-10-CM

## 2018-12-23 DIAGNOSIS — O99013 Anemia complicating pregnancy, third trimester: Secondary | ICD-10-CM | POA: Insufficient documentation

## 2018-12-23 DIAGNOSIS — K831 Obstruction of bile duct: Secondary | ICD-10-CM

## 2018-12-23 DIAGNOSIS — O2233 Deep phlebothrombosis in pregnancy, third trimester: Secondary | ICD-10-CM

## 2018-12-23 DIAGNOSIS — O26613 Liver and biliary tract disorders in pregnancy, third trimester: Secondary | ICD-10-CM

## 2018-12-23 DIAGNOSIS — O26619 Liver and biliary tract disorders in pregnancy, unspecified trimester: Secondary | ICD-10-CM | POA: Diagnosis present

## 2018-12-23 DIAGNOSIS — Z3689 Encounter for other specified antenatal screening: Secondary | ICD-10-CM | POA: Diagnosis present

## 2018-12-23 DIAGNOSIS — O0993 Supervision of high risk pregnancy, unspecified, third trimester: Secondary | ICD-10-CM

## 2018-12-23 DIAGNOSIS — O0990 Supervision of high risk pregnancy, unspecified, unspecified trimester: Secondary | ICD-10-CM

## 2018-12-23 DIAGNOSIS — Z362 Encounter for other antenatal screening follow-up: Secondary | ICD-10-CM

## 2018-12-23 DIAGNOSIS — Z86711 Personal history of pulmonary embolism: Secondary | ICD-10-CM

## 2018-12-23 MED ORDER — URSODIOL 500 MG PO TABS: 500.0000 mg | ORAL_TABLET | Freq: Two times a day (BID) | ORAL | 3 refills | Status: DC

## 2018-12-23 MED ORDER — OMEPRAZOLE 20 MG PO CPDR: 20.0000 mg | DELAYED_RELEASE_CAPSULE | Freq: Every day | ORAL | 3 refills | Status: DC

## 2018-12-23 NOTE — Telephone Encounter (Signed)
Pt called requesting her Ursodial and Prilosec be sent to [facility] instead of [facility].  RX sent

## 2018-12-23 NOTE — Telephone Encounter (Signed)
Patient called in inquiring about an appt for diabetes education that she seen on Mychart and wanted to know what it was about informed patient it was for diabetes education and patient stated she didn't know anything about it and wanted appt cancelled,sent msg to [facility] office to cancel appt.

## 2018-12-24 ENCOUNTER — Other Ambulatory Visit: Payer: 59

## 2018-12-26 ENCOUNTER — Inpatient Hospital Stay (HOSPITAL_COMMUNITY)
Admission: AD | Admit: 2018-12-26 | Discharge: 2018-12-26 | Disposition: A | Discharge status: Home / Self Care | Payer: 59 | Attending: Obstetrics & Gynecology | Admitting: Obstetrics & Gynecology

## 2018-12-26 ENCOUNTER — Encounter (HOSPITAL_COMMUNITY): Payer: Self-pay | Admitting: *Deleted

## 2018-12-26 ENCOUNTER — Other Ambulatory Visit: Payer: Self-pay

## 2018-12-26 DIAGNOSIS — Z885 Allergy status to narcotic agent status: Secondary | ICD-10-CM | POA: Insufficient documentation

## 2018-12-26 DIAGNOSIS — Z7901 Long term (current) use of anticoagulants: Secondary | ICD-10-CM | POA: Insufficient documentation

## 2018-12-26 DIAGNOSIS — Z880 Allergy status to penicillin: Secondary | ICD-10-CM | POA: Diagnosis not present

## 2018-12-26 DIAGNOSIS — H538 Other visual disturbances: Secondary | ICD-10-CM | POA: Insufficient documentation

## 2018-12-26 DIAGNOSIS — Z888 Allergy status to other drugs, medicaments and biological substances status: Secondary | ICD-10-CM | POA: Diagnosis not present

## 2018-12-26 DIAGNOSIS — Z86711 Personal history of pulmonary embolism: Secondary | ICD-10-CM | POA: Diagnosis not present

## 2018-12-26 DIAGNOSIS — O26893 Other specified pregnancy related conditions, third trimester: Secondary | ICD-10-CM | POA: Diagnosis present

## 2018-12-26 DIAGNOSIS — R112 Nausea with vomiting, unspecified: Secondary | ICD-10-CM

## 2018-12-26 DIAGNOSIS — Z3A28 28 weeks gestation of pregnancy: Secondary | ICD-10-CM

## 2018-12-26 DIAGNOSIS — Z86718 Personal history of other venous thrombosis and embolism: Secondary | ICD-10-CM | POA: Diagnosis not present

## 2018-12-26 DIAGNOSIS — Z79899 Other long term (current) drug therapy: Secondary | ICD-10-CM | POA: Insufficient documentation

## 2018-12-26 LAB — COMPREHENSIVE METABOLIC PANEL
ALT: 12 U/L (ref 0–44)
AST: 14 U/L — ABNORMAL LOW (ref 15–41)
Albumin: 3.1 g/dL — ABNORMAL LOW (ref 3.5–5.0)
Alkaline Phosphatase: 65 U/L (ref 38–126)
Anion gap: 8 (ref 5–15)
BUN: 8 mg/dL (ref 6–20)
CO2: 22 mmol/L (ref 22–32)
Calcium: 9.1 mg/dL (ref 8.9–10.3)
Chloride: 108 mmol/L (ref 98–111)
Creatinine, Ser: 0.52 mg/dL (ref 0.44–1.00)
GFR calc Af Amer: >60 mL/min (ref >60)
GFR calc non Af Amer: >60 mL/min (ref >60)
Glucose, Bld: 86 mg/dL (ref 70–99)
Potassium: 4.1 mmol/L (ref 3.5–5.1)
Sodium: 138 mmol/L (ref 135–145)
Total Bilirubin: 0.5 mg/dL (ref 0.3–1.2)
Total Protein: 6 g/dL — ABNORMAL LOW (ref 6.5–8.1)

## 2018-12-26 LAB — URINALYSIS, ROUTINE W REFLEX MICROSCOPIC
Bilirubin Urine: NEGATIVE
Glucose, UA: NEGATIVE mg/dL
Hgb urine dipstick: NEGATIVE
Ketones, ur: NEGATIVE mg/dL
Leukocytes,Ua: NEGATIVE
Nitrite: NEGATIVE
Protein, ur: NEGATIVE mg/dL
Specific Gravity, Urine: 1.008 (ref 1.005–1.030)
pH: 7 (ref 5.0–8.0)

## 2018-12-26 LAB — CBC
HCT: 34.2 % — ABNORMAL LOW (ref 36.0–46.0)
Hemoglobin: 11.2 g/dL — ABNORMAL LOW (ref 12.0–15.0)
MCH: 31.2 pg (ref 26.0–34.0)
MCHC: 32.7 g/dL (ref 30.0–36.0)
MCV: 95.3 fL (ref 80.0–100.0)
Platelets: 282 10*3/uL (ref 150–400)
RBC: 3.59 MIL/uL — ABNORMAL LOW (ref 3.87–5.11)
RDW: 12.8 % (ref 11.5–15.5)
WBC: 7.8 10*3/uL (ref 4.0–10.5)
nRBC: 0 % (ref 0.0–0.2)

## 2018-12-26 NOTE — MAU Provider Note (Signed)
History     CSN: [id number]  Arrival date and time: 12/26/18 1335   First Provider Initiated Contact with Patient 12/26/18 1529      Chief Complaint  Patient presents with  . Blurred Vision   23 y.o. G1 '@28'$ .6 wks presenting with blurry vision after vomiting this am. Reports emesis x2 around 1045 today, then about 2 hrs later had blurry vision which lasted about 1 hr. No floaters. No HA. No weakness or syncope. Denies nausea now, tolerated lunch w/o problem. Concerned that Actigall could have caused it which she's been on for 2 days. Reports good FM. No VB, LOF, or ctx.    OB History    Gravida  1   Para      Term      Preterm      AB      Living        SAB      TAB      Ectopic      Multiple      Live Births              Past Medical History:  Diagnosis Date  . ADD (attention deficit disorder)   . Arcuate uterus   . Bicuspid aortic valve   . Cholestasis of pregnancy   . History of DVT (deep vein thrombosis)   . POTS (postural orthostatic tachycardia syndrome)   . Pulmonary embolism ([facility])   . Vitamin B12 deficiency     Past Surgical History:  Procedure Laterality Date  . TYMPANOSTOMY    . WISDOM TOOTH EXTRACTION    . WRIST SURGERY      Family History  Problem Relation Age of Onset  . Hypertension Mother   . Polycystic ovary syndrome Mother   . Kidney cancer Mother   . Diabetes Father   . Testicular cancer Father   . Polycystic ovary syndrome Sister   . Aneurysm Maternal Grandmother   . Aneurysm Paternal Grandmother     Social History   Tobacco Use  . Smoking status: Never Smoker  . Smokeless tobacco: Never Used  Substance Use Topics  . Alcohol use: No  . Drug use: No    Allergies:  Allergies  Allergen Reactions  . Methylphenidate Itching  . Morphine Nausea And Vomiting  . Concerta [Methylphenidate Hcl Er (Cd)]   . Dexmethylphenidate Itching  . Penicillins Other (See Comments)    Reaction unknown  . Vyvanse  [Lisdexamfetamine Dimesylate]     Medications Prior to Admission  Medication Sig Dispense Refill Last Dose  . enoxaparin (LOVENOX) 40 MG/0.4ML injection    12/25/2018 at Unknown time  . ferrous sulfate (FERROUSUL) 325 (65 FE) MG tablet Take 1 tablet (325 mg total) by mouth 2 (two) times daily. 60 tablet 1 12/25/2018 at Unknown time  . Prenatal Vit-Fe Fumarate-FA (PRENATAL VITAMINS PO) Take by mouth.   12/25/2018 at Unknown time  . ursodiol (ACTIGALL) 500 MG tablet Take 1 tablet (500 mg total) by mouth 2 (two) times daily. 60 tablet 3 12/26/2018 at Unknown time  . Blood Pressure Monitoring (BLOOD PRESSURE KIT) DEVI 1 Device by Does not apply route once a week. To use device to obtain BP weekly for BRX 1 Device 0   . docusate sodium (COLACE) 100 MG capsule Take 1 capsule (100 mg total) by mouth 2 (two) times daily as needed for mild constipation or moderate constipation. (Patient not taking: Reported on 12/23/2018) 30 capsule 2   . hydrOXYzine (ATARAX/VISTARIL) 10 MG  tablet Take 1 tablet (10 mg total) by mouth 3 (three) times daily as needed. (Patient not taking: Reported on 12/22/2018) 30 tablet 0   . Misc. Devices (BREAST PUMP) MISC 1 Device by Does not apply route daily. 1 each 0   . omeprazole (PRILOSEC) 20 MG capsule Take 1 capsule (20 mg total) by mouth daily. (Patient not taking: Reported on 12/23/2018) 30 capsule 3     Review of Systems  Eyes: Positive for visual disturbance.  Gastrointestinal: Positive for nausea and vomiting. Negative for abdominal pain.  Genitourinary: Negative for vaginal bleeding.  Neurological: Negative for syncope, weakness, light-headedness and headaches.   Physical Exam   Blood pressure (!) 105/56, pulse 78, resp. rate 20, height '5\' 4"'$  (1.626 m), weight 76.2 kg, last menstrual period 06/07/2018.  Physical Exam  Nursing note and vitals reviewed. Constitutional: She is oriented to person, place, and time. She appears well-developed and well-nourished. No distress.   HENT:  Head: Normocephalic and atraumatic.  Eyes: Pupils are equal, round, and reactive to light.  Neck: Normal range of motion.  Cardiovascular: Normal rate.  Respiratory: Effort normal. No respiratory distress.  Musculoskeletal: Normal range of motion.  Neurological: She is alert and oriented to person, place, and time. No cranial nerve deficit.  Skin: Skin is warm and dry.  Psychiatric: She has a normal mood and affect.  EFM: 140 bpm, mod variability, + accels, rare variable decels Toco: irritability  Results for orders placed or performed during the hospital encounter of 12/26/18 (from the past 24 hour(s))  Urinalysis, Routine w reflex microscopic     Status: None   Collection Time: 12/26/18  3:30 PM  Result Value Ref Range   Color, Urine YELLOW YELLOW   APPearance CLEAR CLEAR   Specific Gravity, Urine 1.008 1.005 - 1.030   pH 7.0 5.0 - 8.0   Glucose, UA NEGATIVE NEGATIVE mg/dL   Hgb urine dipstick NEGATIVE NEGATIVE   Bilirubin Urine NEGATIVE NEGATIVE   Ketones, ur NEGATIVE NEGATIVE mg/dL   Protein, ur NEGATIVE NEGATIVE mg/dL   Nitrite NEGATIVE NEGATIVE   Leukocytes,Ua NEGATIVE NEGATIVE  CBC     Status: Abnormal   Collection Time: 12/26/18  4:08 PM  Result Value Ref Range   WBC 7.8 4.0 - 10.5 K/uL   RBC 3.59 (L) 3.87 - 5.11 MIL/uL   Hemoglobin 11.2 (L) 12.0 - 15.0 g/dL   HCT 34.2 (L) 36.0 - 46.0 %   MCV 95.3 80.0 - 100.0 fL   MCH 31.2 26.0 - 34.0 pg   MCHC 32.7 30.0 - 36.0 g/dL   RDW 12.8 11.5 - 15.5 %   Platelets 282 150 - 400 K/uL   nRBC 0.0 0.0 - 0.2 %  Comprehensive metabolic panel     Status: Abnormal   Collection Time: 12/26/18  4:08 PM  Result Value Ref Range   Sodium 138 135 - 145 mmol/L   Potassium 4.1 3.5 - 5.1 mmol/L   Chloride 108 98 - 111 mmol/L   CO2 22 22 - 32 mmol/L   Glucose, Bld 86 70 - 99 mg/dL   BUN 8 6 - 20 mg/dL   Creatinine, Ser 0.52 0.44 - 1.00 mg/dL   Calcium 9.1 8.9 - 10.3 mg/dL   Total Protein 6.0 (L) 6.5 - 8.1 g/dL   Albumin 3.1  (L) 3.5 - 5.0 g/dL   AST 14 (L) 15 - 41 U/L   ALT 12 0 - 44 U/L   Alkaline Phosphatase 65 38 - 126 U/L  Total Bilirubin 0.5 0.3 - 1.2 mg/dL   GFR calc non Af Amer >60 >60 mL/min   GFR calc Af Amer >60 >60 mL/min   Anion gap 8 5 - 15   MAU Course  Procedures  MDM Labs ordered and reviewed. Normal neuro exam. No certain explanation for symptoms, could be from the Actigall or possibly a hypotensive episode d/t pregnancy. Pt reassured evaluation today is normal. Stable for discharge home.   Assessment and Plan   1. [redacted] weeks gestation of pregnancy   2. Blurred vision   3. Non-intractable vomiting with nausea, unspecified vomiting type    Discharge home [facility] as scheduled Notify MD for persist sx  Allergies as of 12/26/2018      Reactions   Methylphenidate Itching   Morphine Nausea And Vomiting   Concerta [methylphenidate Hcl Er (cd)]    Dexmethylphenidate Itching   Penicillins Other (See Comments)   Reaction unknown   Vyvanse [lisdexamfetamine Dimesylate]       Medication List    STOP taking these medications   docusate sodium 100 MG capsule Commonly known as: COLACE   hydrOXYzine 10 MG tablet Commonly known as: ATARAX/VISTARIL   omeprazole 20 MG capsule Commonly known as: PriLOSEC     TAKE these medications   Blood Pressure Kit Devi 1 Device by Does not apply route once a week. To use device to obtain BP weekly for BRX   Breast Pump Misc 1 Device by Does not apply route daily.   enoxaparin 40 MG/0.4ML injection Commonly known as: LOVENOX   ferrous sulfate 325 (65 FE) MG tablet Commonly known as: FerrouSul Take 1 tablet (325 mg total) by mouth 2 (two) times daily.   PRENATAL VITAMINS PO Take by mouth.   ursodiol 500 MG tablet Commonly known as: ACTIGALL Take 1 tablet (500 mg total) by mouth 2 (two) times daily.      Julianne Handler, CNM 12/26/2018, 5:32 PM

## 2018-12-26 NOTE — MAU Note (Signed)
.   Sabrina Riddle is a 23 y.o. at [redacted]w[redacted]d here in MAU reporting: vomiting that started 10 this morning and she has had blurred vision since around 1215. Denies any vaginal bleeding LMP: Onset of complaint: this morning Pain score: 0 Vitals:   12/26/18 1428  BP: 112/60  Pulse: 81     FHT:135 Lab orders placed from triage: UA

## 2018-12-26 NOTE — Discharge Instructions (Signed)
Blurred Vision, Adult        Having blurred vision means that you cannot see things clearly. Your vision may seem fuzzy or out of focus. It can involve your vision for objects that are close or far away. It may affect one or both eyes. There are many causes of blurred vision, including cataracts, macular degeneration, eye inflammation (uveitis), and diabetic retinopathy. In many cases, blurred vision has to do with the shape of your eye. An abnormal eye shape means you cannot focus well (refractive error). When this happens, it can cause:  Faraway objects to look blurry (nearsightedness).  Close objects to look blurry (farsightedness).  Blurry vision at any distance (astigmatism). Refractive errors are often corrected with glasses or contacts. Blurred vision can be diagnosed based on your symptoms and a physical exam. Tell your health care provider about any other health problems you have, any recent eye injury, and any prior surgeries. You may need to see a health care provider who specializes in eye problems (ophthalmologist). Your treatment will depend on what is causing your blurred vision. Follow these instructions at home:  Keep all follow-up visits as told by your health care provider. This is important. These include any visits to your eye specialists.  Do not drive or use heavy machinery if your vision is blurry.  Use eye drops only as told by your health care provider.  If you were prescribed glasses or contact lenses, wear the glasses or contacts as told by your health care provider.  Schedule eye exams regularly.  Pay attention to any changes in your symptoms. Contact a health care provider if:  Your symptoms do not improve or they get worse.  You have: ? New symptoms. ? A headache. ? Trouble seeing at night. ? Trouble noticing the difference between colors.  You notice: ? Drooping of your eyelids. ? Drainage coming from your eyes. ? A rash around your eyes. Get  help right away if:  You have: ? Severe eye pain. ? A severe headache. ? A sudden change in vision. ? A sudden loss of vision. ? A vision change after an injury.  You notice flashing lights in your field of vision. Your field of vision is the area that you can see without moving your eyes. Summary  Having blurred vision means that you cannot see things clearly. Your vision may seem fuzzy or out of focus.  There are many causes of blurred vision. In many cases, blurred vision has to do with an abnormal eye shape (refractive error), and it can be corrected with glasses or contact lenses.  Pay attention to any changes in your symptoms. Contact a health care provider if your symptoms do not improve or if you have any new symptoms. This information is not intended to replace advice given to you by your health care provider. Make sure you discuss any questions you have with your health care provider. Document Released: 01/11/2003 Document Revised: 04/04/2017 Document Reviewed: 04/27/2016 Elsevier Patient Education  [place].

## 2018-12-30 ENCOUNTER — Telehealth: Payer: 59 | Admitting: Certified Nurse Midwife

## 2019-01-05 ENCOUNTER — Telehealth (INDEPENDENT_AMBULATORY_CARE_PROVIDER_SITE_OTHER): Payer: 59 | Admitting: Obstetrics & Gynecology

## 2019-01-05 ENCOUNTER — Encounter: Payer: Self-pay | Admitting: Obstetrics & Gynecology

## 2019-01-05 DIAGNOSIS — Z86718 Personal history of other venous thrombosis and embolism: Secondary | ICD-10-CM

## 2019-01-05 DIAGNOSIS — Z3A30 30 weeks gestation of pregnancy: Secondary | ICD-10-CM

## 2019-01-05 DIAGNOSIS — K831 Obstruction of bile duct: Secondary | ICD-10-CM

## 2019-01-05 DIAGNOSIS — O26619 Liver and biliary tract disorders in pregnancy, unspecified trimester: Secondary | ICD-10-CM

## 2019-01-05 DIAGNOSIS — O26613 Liver and biliary tract disorders in pregnancy, third trimester: Secondary | ICD-10-CM

## 2019-01-05 NOTE — Progress Notes (Signed)
Patient ID: Sabrina Riddle, female   DOB: 02/10/1995, 23 y.o.   MRN: [id number]    TELEHEALTH OBSTETRICS PRENATAL VIRTUAL VIDEO VISIT ENCOUNTER NOTE  Provider location: [facility] [place]   I connected with Sabrina Riddle on 01/05/19 at  9:15 AM EST by MyChart Video Encounter at home and verified that I am speaking with the correct person using two identifiers.   I discussed the limitations, risks, security and privacy concerns of performing an evaluation and management service virtually and the availability of in person appointments. I also discussed with the patient that there may be a patient responsible charge related to this service. The patient expressed understanding and agreed to proceed. Subjective:  Sabrina Riddle is a 23 y.o. G1P0 at 349w2d being seen today for ongoing prenatal care.  She is currently monitored for the following issues for this high-risk pregnancy and has Supervision of high risk pregnancy, antepartum; ADD (attention deficit disorder); History of DVT (deep vein thrombosis); Postural orthostatic tachycardia syndrome; Tachycardia; Rh negative state in antepartum period; Anemia affecting pregnancy in third trimester; and Cholestasis during pregnancy, antepartum on their problem list.  Patient reports no complaints.   .  .   . Denies any leaking of fluid.   The following portions of the patient's history were reviewed and updated as appropriate: allergies, current medications, past family history, past medical history, past social history, past surgical history and problem list.   Objective:  There were no vitals filed for this visit.  Fetal Status:           General:  Alert, oriented and cooperative. Patient is in no acute distress.  Respiratory: Normal respiratory effort, no problems with respiration noted  Mental Status: Normal mood and affect. Normal behavior. Normal judgment and thought content.  Rest of physical exam  deferred due to type of encounter  Imaging: US MFM OB FOLLOW UP  Result Date: 12/23/2018 ----------------------------------------------------------------------  OBSTETRICS REPORT                       (Signed Final 12/23/2018 01:20 pm) ---------------------------------------------------------------------- Patient Info  ID #:       [id number]                          D.O.B.:  1995-06-20 (23 yrs)  Name:       Sabrina Riddle                Visit Date: 12/23/2018 10:22 am ---------------------------------------------------------------------- Performed By  Performed By:     Lenise ArenaHannah Bazemore        Ref. Address:     [address]                    [address]                                                             [address]  [place], [place]                                                             [address]  Attending:        Noralee Space MD        [facility]  Referred By:      Allie Bossier MD ---------------------------------------------------------------------- Orders   #  Description                          Code         Ordered By   1  [place] MFM OB FOLLOW UP                  60454.09     Rosana Hoes  ----------------------------------------------------------------------   #  Order #                    Accession #                 Episode #   1  [id number]                  [id number]                  [id number]  ---------------------------------------------------------------------- Indications   Cholestasis of pregnancy, third trimester      M84.132G40.1   Deep vein thrombosis (DVT)                     O22.30   High risk pregnancy (postural orthostatic      O09.90   tachycardia synd)   History of PE during pregnancy                 Z86.711   [redacted] weeks gestation of pregnancy                Z3A.28  ---------------------------------------------------------------------- Vital Signs                                                  Height:        5'4" ---------------------------------------------------------------------- Fetal Evaluation  Num Of Fetuses:         1  Fetal Heart Rate(bpm):  138  Cardiac Activity:       Observed  Presentation:           Breech  Placenta:               Posterior Fundal  P. Cord Insertion:      Previously Visualized  Amniotic Fluid  AFI FV:      Within normal limits  AFI Sum(cm)     %Tile  Largest Pocket(cm)  20.31           81          6.2  RUQ(cm)       RLQ(cm)       LUQ(cm)        LLQ(cm)  6.2           5.89          3.75           4.47 ---------------------------------------------------------------------- Biometry  BPD:      71.2  mm     G. Age:  28w 4d         43  %    CI:         73.1   %    70 - 86                                                          FL/HC:      18.1   %    18.8 - 20.6  HC:      264.7  mm     G. Age:  28w 6d         29  %    HC/AC:      1.07        1.05 - 1.21  AC:      247.7  mm     G. Age:  29w 0d         61  %    FL/BPD:     67.3   %    71 - 87  FL:       47.9  mm     G. Age:  26w 0d          1  %    FL/AC:      19.3   %    20 - 24  HUM:      44.9  mm     G. Age:  26w 4d          5  %  LV:        4.2  mm  Est. FW:    1154  gm      2 lb 9 oz     22  % ---------------------------------------------------------------------- OB History  Gravidity:    1 ---------------------------------------------------------------------- Gestational Age  LMP:           28w 3d        Date:  06/07/18                 EDD:   03/14/19  U/S Today:     28w 1d                                        EDD:   03/16/19  Best:          28w 3d     Det. By:  LMP  (06/07/18)          EDD:   03/14/19 ---------------------------------------------------------------------- Anatomy  Cranium:               Appears normal         Aortic  Arch:            Previously seen  Cavum:                 Appears normal         Ductal Arch:            Previously seen  Ventricles:             Appears normal         Diaphragm:              Appears normal  Choroid Plexus:        Previously seen        Stomach:                Appears normal, left                                                                        sided  Cerebellum:            Previously seen        Abdomen:                Appears normal  Posterior Fossa:       Previously seen        Abdominal Wall:         Previously seen  Nuchal Fold:           Previously seen        Cord Vessels:           Previously seen  Face:                  Orbits and profile     Kidneys:                Appear normal                         previously seen  Lips:                  Previously seen        Bladder:                Appears normal  Thoracic:              Appears normal         Spine:                  Previously seen  Heart:                 Previously seen        Upper Extremities:      Previously seen  RVOT:                  Previously seen        Lower Extremities:      Previously seen  LVOT:                  Previously seen  Other:  Female gender Nasal bone previously seen. Heels and 5th digit          previously seen. ---------------------------------------------------------------------- Cervix Uterus Adnexa  Cervix  Not  visualized (advanced GA >24wks) ---------------------------------------------------------------------- Impression  Patient has a diagnosis of cholestasis of pregnancy. She  takes ursodiol. She still has generalized itching.  On ultrasound, amniotic fluid is normal good fetal activity  seen. Fetal growth is appropriate for gestational age.  Incidentally observed antenatal testing is reassuring (BPP  8/8).  I explained the limitations of antenatal testing in predicting  fetal compromise. Stillbirth usually occurs at gestational age  greater than 37 weeks and can be associated with very high  bile acid levels. ---------------------------------------------------------------------- Recommendations  -Appointments were made for weekly BPP  from 30 weeks (2  weeks from now).  -Delivery at 37 weeks. ----------------------------------------------------------------------                  Noralee Space, MD Electronically Signed Final Report   12/23/2018 01:20 pm ----------------------------------------------------------------------   Assessment and Plan:  Pregnancy: G1P0 at [redacted]w[redacted]d  1.  Constipation--anemia better (hgb 11.2).  Will stop iron for now.  Start Miralax and continue colace.  Pt may also use an enema x1.  Pt aware in can cause contractions/cramping.  Pt has hemorrhoids and has bled after hard BMs.    2.  Fetal movement is good.  Less big kicks and more rolling mvmt.    3.  Continue Heparin  4.  Cholestasis of pregnancy:  Continue MFM testing and growth [place].  Preterm labor symptoms and general obstetric precautions including but not limited to vaginal bleeding, contractions, leaking of fluid and fetal movement were reviewed in detail with the patient. I discussed the assessment and treatment plan with the patient. The patient was provided an opportunity to ask questions and all were answered. The patient agreed with the plan and demonstrated an understanding of the instructions. The patient was advised to call back or seek an in-person office evaluation/go to MAU at [facility] for any urgent or concerning symptoms. Please refer to After Visit Summary for other counseling recommendations.   I provided 15 minutes of face-to-face time during this encounter.  No follow-ups on file.  Future Appointments  Date Time Provider Department Center  01/07/2019 11:00 AM WH-MFC [place] 3 WH-MFCUS MFC-US  01/07/2019 11:10 AM WH-MFC NURSE WH-MFC MFC-US  01/13/2019  9:00 AM Sharyon Cable, CNM [facility]  01/14/2019 10:30 AM WH-MFC [place] 1 WH-MFCUS MFC-US  01/14/2019 10:40 AM WH-MFC NURSE WH-MFC MFC-US  01/20/2019 10:15 AM WH-MFC [place] 4 WH-MFCUS MFC-US  01/20/2019 10:20 AM WH-MFC NURSE WH-MFC MFC-US    Elsie Lincoln, MD [facility] [employer], [facility]

## 2019-01-07 ENCOUNTER — Encounter (HOSPITAL_COMMUNITY): Payer: Self-pay

## 2019-01-07 ENCOUNTER — Other Ambulatory Visit: Payer: Self-pay

## 2019-01-07 ENCOUNTER — Ambulatory Visit (HOSPITAL_COMMUNITY): Payer: 59 | Admitting: *Deleted

## 2019-01-07 ENCOUNTER — Ambulatory Visit (HOSPITAL_COMMUNITY)
Admission: RE | Admit: 2019-01-07 | Discharge: 2019-01-07 | Disposition: A | Discharge status: Home / Self Care | Payer: 59 | Source: Ambulatory Visit | Attending: Obstetrics and Gynecology | Admitting: Obstetrics and Gynecology

## 2019-01-07 DIAGNOSIS — O26613 Liver and biliary tract disorders in pregnancy, third trimester: Secondary | ICD-10-CM | POA: Insufficient documentation

## 2019-01-07 DIAGNOSIS — K831 Obstruction of bile duct: Secondary | ICD-10-CM | POA: Diagnosis not present

## 2019-01-07 DIAGNOSIS — O99013 Anemia complicating pregnancy, third trimester: Secondary | ICD-10-CM

## 2019-01-07 DIAGNOSIS — O0990 Supervision of high risk pregnancy, unspecified, unspecified trimester: Secondary | ICD-10-CM | POA: Diagnosis present

## 2019-01-07 DIAGNOSIS — O0993 Supervision of high risk pregnancy, unspecified, third trimester: Secondary | ICD-10-CM

## 2019-01-07 DIAGNOSIS — O26619 Liver and biliary tract disorders in pregnancy, unspecified trimester: Secondary | ICD-10-CM | POA: Insufficient documentation

## 2019-01-07 DIAGNOSIS — O2233 Deep phlebothrombosis in pregnancy, third trimester: Secondary | ICD-10-CM | POA: Diagnosis not present

## 2019-01-07 DIAGNOSIS — O26643 Intrahepatic cholestasis of pregnancy, third trimester: Secondary | ICD-10-CM

## 2019-01-07 DIAGNOSIS — Z3A30 30 weeks gestation of pregnancy: Secondary | ICD-10-CM

## 2019-01-13 ENCOUNTER — Encounter: Payer: Self-pay | Admitting: Certified Nurse Midwife

## 2019-01-13 ENCOUNTER — Ambulatory Visit (INDEPENDENT_AMBULATORY_CARE_PROVIDER_SITE_OTHER): Payer: 59 | Admitting: Certified Nurse Midwife

## 2019-01-13 ENCOUNTER — Other Ambulatory Visit: Payer: Self-pay

## 2019-01-13 VITALS — BP 112/70 | HR 101 | Temp 97.9°F | Wt 170.0 lb

## 2019-01-13 DIAGNOSIS — O0993 Supervision of high risk pregnancy, unspecified, third trimester: Secondary | ICD-10-CM

## 2019-01-13 DIAGNOSIS — O26613 Liver and biliary tract disorders in pregnancy, third trimester: Secondary | ICD-10-CM

## 2019-01-13 DIAGNOSIS — O99613 Diseases of the digestive system complicating pregnancy, third trimester: Secondary | ICD-10-CM

## 2019-01-13 DIAGNOSIS — K831 Obstruction of bile duct: Secondary | ICD-10-CM

## 2019-01-13 DIAGNOSIS — Z86718 Personal history of other venous thrombosis and embolism: Secondary | ICD-10-CM

## 2019-01-13 DIAGNOSIS — O26619 Liver and biliary tract disorders in pregnancy, unspecified trimester: Secondary | ICD-10-CM

## 2019-01-13 DIAGNOSIS — O26893 Other specified pregnancy related conditions, third trimester: Secondary | ICD-10-CM | POA: Diagnosis not present

## 2019-01-13 DIAGNOSIS — Z6791 Unspecified blood type, Rh negative: Secondary | ICD-10-CM

## 2019-01-13 DIAGNOSIS — K5900 Constipation, unspecified: Secondary | ICD-10-CM

## 2019-01-13 DIAGNOSIS — O0990 Supervision of high risk pregnancy, unspecified, unspecified trimester: Secondary | ICD-10-CM

## 2019-01-13 DIAGNOSIS — Z3A31 31 weeks gestation of pregnancy: Secondary | ICD-10-CM

## 2019-01-13 DIAGNOSIS — O360930 Maternal care for other rhesus isoimmunization, third trimester, not applicable or unspecified: Secondary | ICD-10-CM

## 2019-01-13 NOTE — Progress Notes (Signed)
Per MFM she needs another Bile Acids drawn

## 2019-01-13 NOTE — Progress Notes (Signed)
Really struggling with constipation

## 2019-01-13 NOTE — Progress Notes (Signed)
   PRENATAL VISIT NOTE  Subjective:  Sabrina Riddle is a 23 y.o. G1P0 at [redacted]w[redacted]d being seen today for ongoing prenatal care.  She is currently monitored for the following issues for this high-risk pregnancy and has Supervision of high risk pregnancy, antepartum; ADD (attention deficit disorder); History of DVT (deep vein thrombosis); Postural orthostatic tachycardia syndrome; Tachycardia; Rh negative state in antepartum period; Anemia affecting pregnancy in third trimester; and Cholestasis during pregnancy, antepartum on their problem list.  Patient reports no complaints.  Contractions: Not present. Vag. Bleeding: None.  Movement: Present. Denies leaking of fluid.   The following portions of the patient's history were reviewed and updated as appropriate: allergies, current medications, past family history, past medical history, past social history, past surgical history and problem list.   Objective:   Vitals:   01/13/19 0855  BP: 112/70  Pulse: (!) 101  Temp: 97.9 F (36.6 C)  Weight: 170 lb (77.1 kg)    Fetal Status: Fetal Heart Rate (bpm): 145 Fundal Height: 32 cm Movement: Present     General:  Alert, oriented and cooperative. Patient is in no acute distress.  Skin: Skin is warm and dry. No rash noted.   Cardiovascular: Normal heart rate noted  Respiratory: Normal respiratory effort, no problems with respiration noted  Abdomen: Soft, gravid, appropriate for gestational age.  Pain/Pressure: Present     Pelvic: Cervical exam deferred        Extremities: Normal range of motion.  Edema: None  Mental Status: Normal mood and affect. Normal behavior. Normal judgment and thought content.   Assessment and Plan:  Pregnancy: G1P0 at [redacted]w[redacted]d 1. Supervision of high risk pregnancy, antepartum - Patient doing well today, reports continued constipation  - Anticipatory guidance on upcoming appointments   2. Rh negative state in antepartum period - Rhogam PP   3. History of DVT (deep vein  thrombosis) - currently on Lovenox   4. Cholestasis during pregnancy, antepartum - Patient reports need for repeat bile acid  - Patient reports she is taking medication as prescribed and has reduction of symptoms  - Bile acids  5. Constipation during pregnancy in third trimester - Patient reports continued constipation since Thanksgiving- reports only having 2-3 BM since Thanksgiving  - Has tried colace which has not helped  - Educated and discussed clean out with Miralax then start on Magnesium nighty - patient verbalizes understanding  - Encouraged to increased amount of fiber in diet and consumption of water.   Preterm labor symptoms and general obstetric precautions including but not limited to vaginal bleeding, contractions, leaking of fluid and fetal movement were reviewed in detail with the patient. Please refer to After Visit Summary for other counseling recommendations.   Return in about 2 weeks (around 01/27/2019) for [place] in person.  Future Appointments  Date Time Provider [place]  01/14/2019 10:30 AM [place] 1 [facility]  01/14/2019 10:40 AM [facility]-M[facility] NURSE [place] [facility]-US  01/20/2019 10:15 AM [facility] [place] 4 [facility] MFC-US  01/20/2019 10:20 AM [place] NURSE [place] MFC-US  01/26/2019  1:30 PM Guss Bunde, MD [facility]  02/16/2019  9:00 AM Guss Bunde, MD [facility]    Lajean Manes, CNM

## 2019-01-13 NOTE — Patient Instructions (Addendum)
You have constipation which is hard stools that are difficult to pass. It is important to have regular bowel movements every 1-3 days that are soft and easy to pass. Hard stools increase your risk of hemorrhoids and are very uncomfortable.   To prevent constipation you can increase the amount of fiber in your diet. Examples of foods with fiber are leafy greens, whole grain breads, oatmeal and other grains.  It is also important to drink at least eight 8oz glass of water everyday.   If you have not has a bowel movement in 4-5 days you made need to clean out your bowel.  This will have establish normal movement through your bowel.    Miralax Clean out  Take 8 capfuls of miralax in 64 oz of gatorade. You can use any fluid that appeals to you (gatorade, water, juice)  Continue to drink at least eight 8 oz glasses of water throughout the day  You can repeat with another 8 capfuls of miralax in 64 oz of gatorade if you are not having a large amount of stools  You will need to be at home and close to a bathroom for about 8 hours when you do the above as you may need to go to the bathroom frequently.   After you are cleaned out: - start taking Magnesium supplement (250mg ) nighty  - or Start a daily fiber supplement like metamucil or citrucel - You can safely use enemas in pregnancy     Cholestasis of Pregnancy  Cholestasis refers to any condition that causes the flow of digestive fluid (bile) produced by the liver to slow down or stop. Cholestasis of pregnancy is most common toward the end of pregnancy (thirdtrimester), but it can occur any time during pregnancy. The condition often goes away soon after giving birth. Cholestasis may be uncomfortable, but it is usually harmless to you. However, it can be harmful to your baby. Cholestasis may increase the risk of:  Your baby being born too early (preterm delivery).  Your baby having a slow heart rate and lack of oxygen during delivery (fetal  distress).  Losing your baby before delivery (stillbirth). What are the causes? The exact cause of this condition is not known, but it may be related to:  Pregnancy hormones. The gallbladder normally holds bile until you need it to help digest fat in your diet. Pregnancy hormones may cause the flow of bile to slow down and back up into your liver. Bile may then get into your bloodstream and cause cholestasis symptoms.  Changes in your genes (genetic mutations). Specifically, genes that affect how the liver releases bile. What increases the risk? You are more likely to develop this condition if:  You had cholestasis during a previous pregnancy.  You have a family history of cholestasis.  You have liver problems.  You are having multiple babies, such as twins or triplets. What are the signs or symptoms? The most common symptom of this condition is intense itching (pruritus), especially on the palms of your hands and soles of your feet. The itching can spread to the rest of your body and is often worse at night. You will not usually have a rash. Other symptoms may include:  Feeling tired.  Pain in your upper right abdomen.  Dark-colored urine.  Light-colored stools.  Poor appetite.  Yellowish discoloration of your skin and the whites of your eyes (jaundice). How is this diagnosed? This condition is diagnosed based on:  Your medical history.  A  physical exam.  Blood tests. If you have an inherited risk for developing this condition, you may also have genetic testing. How is this treated? The goal of treatment is to make you comfortable and keep your baby safe. Your health care provider may:  Prescribe medicine to reduce bile acid in your bloodstream, relieve symptoms, and help keep your baby safe.  Give you vitamin K before delivery to prevent excessive bleeding.  Check your baby frequently (fetal monitoring).  Perform regular blood tests to check your bile levels and  liver function until your baby is delivered.  Recommend starting (inducing) your labor and delivery by week 36 or 37 of pregnancy, or as soon as your baby's lungs have developed enough. Follow these instructions at home:  Take over-the-counter and prescription medicines only as told by your health care provider.  Take cool baths to soothe itchy skin.  Wear comfortable, loose-fitting, cotton clothing to reduce itching.  Keep your fingernails short to prevent skin irritation from scratching.  Keep all follow-up visits and prenatal visits as told by your health care provider. This is important. Contact a health care provider if:  Your symptoms get worse, even with treatment.  You develop pain in your right side.  You have unusual swelling in your abdomen, feet, ankles, or legs.  You have a fever.  You are more thirsty than usual. Get help right away if:  You go into early labor.  You have a headache that does not go away or causes changes in vision.  You have nausea or you vomit.  You have severe pain in your abdomen or shoulders.  You have shortness of breath. Summary  Cholestasis of pregnancy is most common toward the end of pregnancy (thirdtrimester), but it can occur any time during your pregnancy.  The condition often goes away soon after your baby is born.  The most common symptom of cholestasis of pregnancy is intense itching (pruritus), especially on the palms of your hands and soles of your feet.  This condition may be treated with medicine, frequent monitoring, or starting (inducing) labor and delivery by week 36 or 37 of pregnancy. This information is not intended to replace advice given to you by your health care provider. Make sure you discuss any questions you have with your health care provider. Document Released: 01/06/2000 Document Revised: 05/01/2018 Document Reviewed: 12/24/2015 Elsevier Patient Education  2020 [employer].

## 2019-01-14 ENCOUNTER — Ambulatory Visit (HOSPITAL_COMMUNITY)
Admission: RE | Admit: 2019-01-14 | Discharge: 2019-01-14 | Disposition: A | Discharge status: Home / Self Care | Payer: 59 | Source: Ambulatory Visit | Attending: Obstetrics and Gynecology | Admitting: Obstetrics and Gynecology

## 2019-01-14 ENCOUNTER — Encounter (HOSPITAL_COMMUNITY): Payer: Self-pay

## 2019-01-14 ENCOUNTER — Ambulatory Visit (HOSPITAL_COMMUNITY): Payer: 59 | Admitting: *Deleted

## 2019-01-14 ENCOUNTER — Other Ambulatory Visit (HOSPITAL_COMMUNITY): Payer: Self-pay | Admitting: *Deleted

## 2019-01-14 DIAGNOSIS — O0990 Supervision of high risk pregnancy, unspecified, unspecified trimester: Secondary | ICD-10-CM

## 2019-01-14 DIAGNOSIS — O26619 Liver and biliary tract disorders in pregnancy, unspecified trimester: Secondary | ICD-10-CM

## 2019-01-14 DIAGNOSIS — Z3A31 31 weeks gestation of pregnancy: Secondary | ICD-10-CM

## 2019-01-14 DIAGNOSIS — K831 Obstruction of bile duct: Secondary | ICD-10-CM

## 2019-01-14 DIAGNOSIS — O99013 Anemia complicating pregnancy, third trimester: Secondary | ICD-10-CM | POA: Diagnosis present

## 2019-01-14 DIAGNOSIS — O26613 Liver and biliary tract disorders in pregnancy, third trimester: Secondary | ICD-10-CM | POA: Insufficient documentation

## 2019-01-14 DIAGNOSIS — O0993 Supervision of high risk pregnancy, unspecified, third trimester: Secondary | ICD-10-CM

## 2019-01-14 DIAGNOSIS — O2233 Deep phlebothrombosis in pregnancy, third trimester: Secondary | ICD-10-CM | POA: Diagnosis not present

## 2019-01-19 ENCOUNTER — Telehealth: Payer: Self-pay

## 2019-01-19 NOTE — Telephone Encounter (Signed)
Returning pt call. Pt wanted to know results of Bile Acids blood work. I spoke with [facility] and she said it should be back in the next day or two. Pt is aware. Pt states her heart rate has been elevated lately. She states it was 115 at MFM the other day and 130 at home on some days when she is resting. I offered pt an appt to discuss this with provider but, she declined and would like to mention it at her MFM appt tomorrow. Pt also states that sometimes she feels as if she is having more difficulty breathing but assumes it is due to pregnancy. I explained that it could be because of the pregnancy, how that baby is positioned and the extra weight from pregnancy. Pt denies any COVID symptoms that would go along with feeling as if it harder to breathe. Pt states it is only occasionally. Pt is aware to monitor it and if it gets worse to let us know. Pt states Dr.Leggett told her once her bowel movements are regular again she can begin taking Iron every other day but, pt requesting I send a message to Dr Gala Romney to make sure. Message sent.

## 2019-01-20 ENCOUNTER — Ambulatory Visit (HOSPITAL_COMMUNITY)
Admission: RE | Admit: 2019-01-20 | Discharge: 2019-01-20 | Disposition: A | Discharge status: Home / Self Care | Payer: 59 | Source: Ambulatory Visit | Attending: Obstetrics and Gynecology | Admitting: Obstetrics and Gynecology

## 2019-01-20 ENCOUNTER — Other Ambulatory Visit: Payer: Self-pay

## 2019-01-20 ENCOUNTER — Ambulatory Visit (HOSPITAL_COMMUNITY): Payer: 59 | Admitting: *Deleted

## 2019-01-20 ENCOUNTER — Encounter (HOSPITAL_COMMUNITY): Payer: Self-pay

## 2019-01-20 ENCOUNTER — Other Ambulatory Visit (HOSPITAL_COMMUNITY): Payer: Self-pay | Admitting: *Deleted

## 2019-01-20 DIAGNOSIS — O26613 Liver and biliary tract disorders in pregnancy, third trimester: Secondary | ICD-10-CM

## 2019-01-20 DIAGNOSIS — O26643 Intrahepatic cholestasis of pregnancy, third trimester: Secondary | ICD-10-CM

## 2019-01-20 DIAGNOSIS — O2233 Deep phlebothrombosis in pregnancy, third trimester: Secondary | ICD-10-CM | POA: Diagnosis not present

## 2019-01-20 DIAGNOSIS — O0990 Supervision of high risk pregnancy, unspecified, unspecified trimester: Secondary | ICD-10-CM

## 2019-01-20 DIAGNOSIS — Z362 Encounter for other antenatal screening follow-up: Secondary | ICD-10-CM | POA: Diagnosis not present

## 2019-01-20 DIAGNOSIS — Z3A32 32 weeks gestation of pregnancy: Secondary | ICD-10-CM

## 2019-01-20 DIAGNOSIS — O99013 Anemia complicating pregnancy, third trimester: Secondary | ICD-10-CM | POA: Diagnosis present

## 2019-01-20 DIAGNOSIS — K831 Obstruction of bile duct: Secondary | ICD-10-CM | POA: Insufficient documentation

## 2019-01-20 DIAGNOSIS — O26619 Liver and biliary tract disorders in pregnancy, unspecified trimester: Secondary | ICD-10-CM | POA: Insufficient documentation

## 2019-01-20 DIAGNOSIS — O0993 Supervision of high risk pregnancy, unspecified, third trimester: Secondary | ICD-10-CM

## 2019-01-20 NOTE — Progress Notes (Signed)
Pt reports RUQ pain that comes and goes since Saturday.

## 2019-01-22 LAB — BILE ACIDS, TOTAL: Bile Acids Total: 15 umol/L (ref 0–19)

## 2019-01-23 NOTE — L&D Delivery Note (Addendum)
OB/GYN Faculty Practice Delivery Note  Sabrina Riddle is a 24 y.o. G1P0 s/p SVD at [redacted]w[redacted]d. She was admitted for IOL for cholestasis.   ROM: 9h 25m with clear fluid GBS Status:  Negative/-- (01/13 0000)  Labor Progress: . Patient presented to L&D for IOL for cholestasis. Initial SVE: closed. She then progressed to complete.   Delivery Date/Time: 02/15/2019 @ 1137 Delivery: Called to room and patient was complete and pushing. Head delivered without difficulty. No nuchal cord present. Shoulder delivery with gentle downward traction and body delivered in usual fashion. Infant with spontaneous cry, placed on mother's abdomen, dried and stimulated. Cord clamped x 2 after 1-minute delay, and cut by FOB. Cord blood drawn. Placenta delivered spontaneously with gentle cord traction. Fundus firm with massage and Pitocin. Labia, perineum, vagina, and cervix inspected inspected, 2nd degree vaginal laceration present and repair with 3-0 vicryl.  Mother and baby stable and bonding.   Placenta: Sent to L&D Complications: None Lacerations: 2nd degree vaginal EBL: 1,137 Analgesia: Epidural  Infant: APGAR (1 MIN): 8   APGAR (5 MINS): 9    Sherlyn Lees, SNM, RNC-OB   I confirm that I have verified the information documented in the nurse midwife student's note and that I have also personally reperformed the history, physical exam and all medical decision making activities of this service and have verified that all service and findings are accurately documented in this student's note.   I was gloved and present for entire delivery Delivery was uneventful and tolerated well by patient No difficulty with shoulders Correction to SNM documentation: Placenta delivered spontaneously and grossly intact then cord clamped x 2 and cut by FOB Perineum as described above. Suturing not indicated. Agree with delivery note above.   Raelyn Mora, CNM 02/15/2019 12:54 PM

## 2019-01-26 ENCOUNTER — Ambulatory Visit (INDEPENDENT_AMBULATORY_CARE_PROVIDER_SITE_OTHER): Payer: 59 | Admitting: Obstetrics & Gynecology

## 2019-01-26 ENCOUNTER — Other Ambulatory Visit: Payer: Self-pay

## 2019-01-26 VITALS — BP 113/69 | HR 97 | Temp 98.0°F | Wt 171.0 lb

## 2019-01-26 DIAGNOSIS — K831 Obstruction of bile duct: Secondary | ICD-10-CM | POA: Diagnosis not present

## 2019-01-26 DIAGNOSIS — Z3A33 33 weeks gestation of pregnancy: Secondary | ICD-10-CM | POA: Diagnosis not present

## 2019-01-26 DIAGNOSIS — O26613 Liver and biliary tract disorders in pregnancy, third trimester: Secondary | ICD-10-CM

## 2019-01-26 DIAGNOSIS — O99013 Anemia complicating pregnancy, third trimester: Secondary | ICD-10-CM

## 2019-01-26 DIAGNOSIS — Z86718 Personal history of other venous thrombosis and embolism: Secondary | ICD-10-CM

## 2019-01-26 MED ORDER — LACTULOSE 20 GM/30ML PO SOLN: ORAL | 0 refills | Status: DC

## 2019-01-26 NOTE — Progress Notes (Signed)
   PRENATAL VISIT NOTE  Subjective:  Sabrina Riddle is a 24 y.o. G1P0 at [redacted]w[redacted]d being seen today for ongoing prenatal care.  She is currently monitored for the following issues for this high-risk pregnancy and has Supervision of high risk pregnancy, antepartum; ADD (attention deficit disorder); History of DVT (deep vein thrombosis); Postural orthostatic tachycardia syndrome; Tachycardia; Rh negative state in antepartum period; Anemia affecting pregnancy in third trimester; and Cholestasis during pregnancy, antepartum on their problem list.  Patient reports constipation--was better with magnesium but now returned.  Has not restarted iron yet..  Contractions: Irritability. Vag. Bleeding: None.  Movement: Present. Denies leaking of fluid.   The following portions of the patient's history were reviewed and updated as appropriate: allergies, current medications, past family history, past medical history, past social history, past surgical history and problem list.   Objective:   Vitals:   01/26/19 1328  BP: 113/69  Pulse: 97  Temp: 98 F (36.7 C)  Weight: 171 lb (77.6 kg)    Fetal Status: Fetal Heart Rate (bpm): 137   Movement: Present     General:  Alert, oriented and cooperative. Patient is in no acute distress.  Skin: Skin is warm and dry. No rash noted.   Cardiovascular: Normal heart rate noted  Respiratory: Normal respiratory effort, no problems with respiration noted  Abdomen: Soft, gravid, appropriate for gestational age.  Pain/Pressure: Present     Pelvic: Cervical exam deferred        Extremities: Normal range of motion.  Edema: None  Mental Status: Normal mood and affect. Normal behavior. Normal judgment and thought content.   Assessment and Plan:  Pregnancy: G1P0 at [redacted]w[redacted]d 1. Anemia affecting pregnancy in third trimester Will try lactulose 30 cc a day.  If no BM in 2 days, she can increase to bid.  She understands too much lactulose can cause diarrhea.    2. Cholestasis  during pregnancy, antepartum Last bile acids 15 and being seen by MFM for growth and antenatal testing.  Pt wants to be delivered at 36 weeks whish is acceptable per ACOG guidelines.  She will receive outpatient steroids two days before and will schedule at 36 weeks 0 days.  She has several conversations with myself and MFM about risks of prematurity and IUFD from cholestasis.  She wants to avoid risk of IUFD with the earliest induction date per ACOG guidelines.  Pt is well read and understands.  3. History of DVT (deep vein thrombosis) Taking lovenox  Preterm labor symptoms and general obstetric precautions including but not limited to vaginal bleeding, contractions, leaking of fluid and fetal movement were reviewed in detail with the patient. Please refer to After Visit Summary for other counseling recommendations.   No follow-ups on file.  Future Appointments  Date Time Provider Department Center  01/30/2019  9:15 AM [facility] NURSE [facility]  01/30/2019  9:15 AM [facility] [place] 4 [facility]  02/06/2019  8:30 AM WH-MFC NURSE [facility]FC MFC-US  02/06/2019  8:30 AM [facility]FC [place] 1 [facility] MFC-US  02/13/2019  8:45 AM WH-MFC NURSE WH-MFC MFC-US  02/13/2019  8:45 AM [facility]FC [place] 2 [facility] MFC-US  02/16/2019  9:00 AM Lesly Dukes, MD [facility]  02/18/2019  9:30 AM [facility]FC [place] 1 [facility]-US  02/18/2019  9:40 AM [facility]FC NURSE [facility]FC [facility]US    Elsie Lincoln, MD

## 2019-01-27 MED ORDER — LACTULOSE 20 GM/30ML PO SOLN: ORAL | 0 refills | Status: DC

## 2019-01-27 NOTE — Telephone Encounter (Signed)
Pt states Lactulose was sent to wrong pharmacy. Old Rx discontinued and new one sent to correct pharmacy.

## 2019-01-28 ENCOUNTER — Telehealth: Payer: Self-pay | Admitting: *Deleted

## 2019-01-28 NOTE — Telephone Encounter (Signed)
Spoke with patient via phone .  She stated that she had not felt any fetal movement like she normally does after she eats breakfast.  Once I did speak with the patient she stated that she did start to have fetal movement after drinking coffee and then the baby got the hiccups.  I encouraged her to do kick counts and to be aware that babies do sleep in utero.  She states that she feels the baby is fine but it was just unusual today that she was noticing a change.  She is encouraged to call if she does have decrease fetal movement.

## 2019-01-30 ENCOUNTER — Ambulatory Visit (HOSPITAL_COMMUNITY): Payer: 59 | Admitting: *Deleted

## 2019-01-30 ENCOUNTER — Other Ambulatory Visit: Payer: Self-pay

## 2019-01-30 ENCOUNTER — Encounter (HOSPITAL_COMMUNITY): Payer: Self-pay

## 2019-01-30 ENCOUNTER — Ambulatory Visit (HOSPITAL_COMMUNITY)
Admission: RE | Admit: 2019-01-30 | Discharge: 2019-01-30 | Disposition: A | Discharge status: Home / Self Care | Payer: 59 | Source: Ambulatory Visit | Attending: Obstetrics and Gynecology | Admitting: Obstetrics and Gynecology

## 2019-01-30 DIAGNOSIS — O99013 Anemia complicating pregnancy, third trimester: Secondary | ICD-10-CM | POA: Diagnosis present

## 2019-01-30 DIAGNOSIS — Z3A33 33 weeks gestation of pregnancy: Secondary | ICD-10-CM

## 2019-01-30 DIAGNOSIS — O0990 Supervision of high risk pregnancy, unspecified, unspecified trimester: Secondary | ICD-10-CM | POA: Insufficient documentation

## 2019-01-30 DIAGNOSIS — K831 Obstruction of bile duct: Secondary | ICD-10-CM

## 2019-01-30 DIAGNOSIS — O26613 Liver and biliary tract disorders in pregnancy, third trimester: Secondary | ICD-10-CM | POA: Diagnosis present

## 2019-01-30 DIAGNOSIS — O26619 Liver and biliary tract disorders in pregnancy, unspecified trimester: Secondary | ICD-10-CM

## 2019-01-30 DIAGNOSIS — O0993 Supervision of high risk pregnancy, unspecified, third trimester: Secondary | ICD-10-CM | POA: Diagnosis not present

## 2019-01-30 DIAGNOSIS — O2233 Deep phlebothrombosis in pregnancy, third trimester: Secondary | ICD-10-CM | POA: Diagnosis not present

## 2019-02-01 ENCOUNTER — Encounter (HOSPITAL_COMMUNITY): Payer: Self-pay | Admitting: Obstetrics and Gynecology

## 2019-02-01 ENCOUNTER — Inpatient Hospital Stay (HOSPITAL_COMMUNITY)
Admission: AD | Admit: 2019-02-01 | Discharge: 2019-02-01 | Disposition: A | Discharge status: Home / Self Care | Payer: 59 | Attending: Obstetrics and Gynecology | Admitting: Obstetrics and Gynecology

## 2019-02-01 ENCOUNTER — Other Ambulatory Visit: Payer: Self-pay

## 2019-02-01 DIAGNOSIS — Z8051 Family history of malignant neoplasm of kidney: Secondary | ICD-10-CM | POA: Diagnosis not present

## 2019-02-01 DIAGNOSIS — O26613 Liver and biliary tract disorders in pregnancy, third trimester: Secondary | ICD-10-CM | POA: Diagnosis not present

## 2019-02-01 DIAGNOSIS — Z8249 Family history of ischemic heart disease and other diseases of the circulatory system: Secondary | ICD-10-CM | POA: Insufficient documentation

## 2019-02-01 DIAGNOSIS — O26619 Liver and biliary tract disorders in pregnancy, unspecified trimester: Secondary | ICD-10-CM

## 2019-02-01 DIAGNOSIS — O4703 False labor before 37 completed weeks of gestation, third trimester: Secondary | ICD-10-CM | POA: Insufficient documentation

## 2019-02-01 DIAGNOSIS — Z888 Allergy status to other drugs, medicaments and biological substances status: Secondary | ICD-10-CM | POA: Insufficient documentation

## 2019-02-01 DIAGNOSIS — Z886 Allergy status to analgesic agent status: Secondary | ICD-10-CM | POA: Insufficient documentation

## 2019-02-01 DIAGNOSIS — O99413 Diseases of the circulatory system complicating pregnancy, third trimester: Secondary | ICD-10-CM | POA: Diagnosis not present

## 2019-02-01 DIAGNOSIS — D649 Anemia, unspecified: Secondary | ICD-10-CM | POA: Insufficient documentation

## 2019-02-01 DIAGNOSIS — Z86711 Personal history of pulmonary embolism: Secondary | ICD-10-CM | POA: Diagnosis not present

## 2019-02-01 DIAGNOSIS — Z8043 Family history of malignant neoplasm of testis: Secondary | ICD-10-CM | POA: Insufficient documentation

## 2019-02-01 DIAGNOSIS — O99013 Anemia complicating pregnancy, third trimester: Secondary | ICD-10-CM | POA: Insufficient documentation

## 2019-02-01 DIAGNOSIS — Z7901 Long term (current) use of anticoagulants: Secondary | ICD-10-CM | POA: Insufficient documentation

## 2019-02-01 DIAGNOSIS — Z833 Family history of diabetes mellitus: Secondary | ICD-10-CM | POA: Insufficient documentation

## 2019-02-01 DIAGNOSIS — Z86718 Personal history of other venous thrombosis and embolism: Secondary | ICD-10-CM | POA: Diagnosis not present

## 2019-02-01 DIAGNOSIS — O0993 Supervision of high risk pregnancy, unspecified, third trimester: Secondary | ICD-10-CM | POA: Diagnosis not present

## 2019-02-01 DIAGNOSIS — Z3A34 34 weeks gestation of pregnancy: Secondary | ICD-10-CM | POA: Insufficient documentation

## 2019-02-01 DIAGNOSIS — Q231 Congenital insufficiency of aortic valve: Secondary | ICD-10-CM | POA: Insufficient documentation

## 2019-02-01 DIAGNOSIS — Z79899 Other long term (current) drug therapy: Secondary | ICD-10-CM | POA: Insufficient documentation

## 2019-02-01 DIAGNOSIS — Z880 Allergy status to penicillin: Secondary | ICD-10-CM | POA: Diagnosis not present

## 2019-02-01 DIAGNOSIS — O0990 Supervision of high risk pregnancy, unspecified, unspecified trimester: Secondary | ICD-10-CM

## 2019-02-01 DIAGNOSIS — K831 Obstruction of bile duct: Secondary | ICD-10-CM | POA: Insufficient documentation

## 2019-02-01 DIAGNOSIS — O479 False labor, unspecified: Secondary | ICD-10-CM

## 2019-02-01 LAB — URINALYSIS, ROUTINE W REFLEX MICROSCOPIC
Bilirubin Urine: NEGATIVE
Glucose, UA: NEGATIVE mg/dL
Hgb urine dipstick: NEGATIVE
Ketones, ur: NEGATIVE mg/dL
Leukocytes,Ua: NEGATIVE
Nitrite: NEGATIVE
Protein, ur: NEGATIVE mg/dL
Specific Gravity, Urine: 1.004 — ABNORMAL LOW (ref 1.005–1.030)
pH: 7 (ref 5.0–8.0)

## 2019-02-01 NOTE — MAU Note (Signed)
Sabrina Riddle is a 24 y.o. at [redacted]w[redacted]d here in MAU reporting:  +contractions +pelvic pain and pressure Onset of complaint: 0645 this morning. Awaken her from sleep. Reports them being so bad she became tearful. Lasted 45 minutes with an interval of 2-5 minutes. Afterwards it slowed down. Turned into a "mild" period like cramp that is constant. Endorses this happened 2 nights ago as well. Pain score: pelvic pain 8/10. Contractions 3/10. Vitals:   02/01/19 1151  BP: 123/69  Pulse: (!) 106  Resp: 18  Temp: 98.4 F (36.9 C)  SpO2: 99%      FHT: +FM Lab orders placed from triage: ua

## 2019-02-01 NOTE — MAU Provider Note (Signed)
Chief Complaint:  Contractions and Pelvic Pain   First Provider Initiated Contact with Patient 02/01/19 1207     HPI: Sabrina Riddle is a 24 y.o. G1P0 at 39w1dwho presents to maternity admissions reporting contractions. States she had an episode of contractions a few nights ago and this morning. Felt painful contractions every 5 minutes for about 45 minutes this morning. Since then has had period like cramping. Denies LOF or vaginal bleeding. Good fetal movement. :   Past Medical History:  Diagnosis Date  . ADD (attention deficit disorder)   . Arcuate uterus   . Bicuspid aortic valve   . Cholestasis of pregnancy   . History of DVT (deep vein thrombosis)   . POTS (postural orthostatic tachycardia syndrome)   . [place]   . Vitamin B12 deficiency    OB History  Gravida Para Term Preterm AB Living  1            SAB TAB Ectopic Multiple Live Births               # Outcome Date GA Lbr Len/2nd Weight Sex Delivery Anes PTL Lv  1 Current            Past Surgical History:  Procedure Laterality Date  . TYMPANOSTOMY    . WISDOM TOOTH EXTRACTION    . WRIST SURGERY     Family History  Problem Relation Age of Onset  . Hypertension Mother   . Polycystic ovary syndrome Mother   . Kidney cancer Mother   . Diabetes Father   . Testicular cancer Father   . Polycystic ovary syndrome Sister   . Aneurysm Maternal Grandmother   . Aneurysm Paternal Grandmother    Social History   Tobacco Use  . Smoking status: Never Smoker  . Smokeless tobacco: Never Used  Substance Use Topics  . Alcohol use: No  . Drug use: No   Allergies  Allergen Reactions  . Methylphenidate Itching  . Morphine Nausea And Vomiting  . Concerta [Methylphenidate Hcl Er (Cd)]   . Dexmethylphenidate Itching  . Penicillins Other (See Comments)    Reaction unknown  . Vyvanse [Lisdexamfetamine Dimesylate]    No medications prior to admission.    I have reviewed patient's Past Medical Hx,  Surgical Hx, Family Hx, Social Hx, medications and allergies.   ROS:  Review of Systems  Constitutional: Negative.   Gastrointestinal: Positive for abdominal pain.  Genitourinary: Negative.     Physical Exam   Patient Vitals for the past 24 hrs:  BP Temp Temp src Pulse Resp SpO2  02/01/19 1310 114/65 -- -- -- -- --  02/01/19 1151 123/69 98.4 F (36.9 C) Oral (!) 106 18 99 %    Constitutional: Well-developed, well-nourished female in no acute distress.  Cardiovascular: normal rate & rhythm, no murmur Respiratory: normal effort, lung sounds clear throughout GI: Abd soft, non-tender, gravid appropriate for gestational age. Pos BS x 4 MS: Extremities nontender, no edema, normal ROM Neurologic: Alert and oriented x 4.  GU:  Dilation: Closed Effacement (%): Thick Cervical Position: Posterior Station: -3 Exam by:: EJorje GuildNP  NST:  Baseline: 130 bpm, Variability: Good {> 6 bpm), Accelerations: Reactive and Decelerations: Absent   Labs: Results for orders placed or performed during the hospital encounter of 02/01/19 (from the past 24 hour(s))  Urinalysis, Routine w reflex microscopic     Status: Abnormal   Collection Time: 02/01/19 12:19 PM  Result Value Ref Range   Color,  Urine STRAW (A) YELLOW   APPearance CLEAR CLEAR   Specific Gravity, Urine 1.004 (L) 1.005 - 1.030   pH 7.0 5.0 - 8.0   Glucose, UA NEGATIVE NEGATIVE mg/dL   Hgb urine dipstick NEGATIVE NEGATIVE   Bilirubin Urine NEGATIVE NEGATIVE   Ketones, ur NEGATIVE NEGATIVE mg/dL   Protein, ur NEGATIVE NEGATIVE mg/dL   Nitrite NEGATIVE NEGATIVE   Leukocytes,Ua NEGATIVE NEGATIVE    Imaging:  No results found.  MAU Course: Orders Placed This Encounter  Procedures  . Urinalysis, Routine w reflex microscopic  . Discharge patient   No orders of the defined types were placed in this encounter.   MDM: Reactive tracing. No contractions. Cervix closed/thick/posterior.  U/a without signs of infection or  dehydration  Assessment: 1. Braxton Hicks contractions   2. Cholestasis during pregnancy, antepartum   3. Anemia affecting pregnancy in third trimester   4. Supervision of high risk pregnancy, antepartum   5. [redacted] weeks gestation of pregnancy     Plan: Discharge home in stable condition.  Preterm Labor precautions and fetal kick counts Follow-up Information    [facility] Follow up.   Specialty: Obstetrics and Gynecology Why: return for worsening symptoms Contact information: [address] [id number] [place] [phone]          Allergies as of 02/01/2019      Reactions   Methylphenidate Itching   Morphine Nausea And Vomiting   Concerta [methylphenidate Hcl Er (cd)]    Dexmethylphenidate Itching   Penicillins Other (See Comments)   Reaction unknown   Vyvanse [lisdexamfetamine Dimesylate]       Medication List    STOP taking these medications   ferrous sulfate 325 (65 FE) MG tablet Commonly known as: FerrouSul     TAKE these medications   Blood Pressure Kit Devi 1 Device by Does not apply route once a week. To use device to obtain BP weekly for BRX   Breast Pump Misc 1 Device by Does not apply route daily.   DSS 100 MG Caps Take by mouth.   enoxaparin 40 MG/0.4ML injection Commonly known as: LOVENOX   Lactulose 20 GM/30ML Soln Take 30 cc daily and titrate to bowel movement.   magnesium oxide 400 MG tablet Commonly known as: MAG-OX Take 400 mg by mouth daily.   polyethylene glycol 17 g packet Commonly known as: MIRALAX / GLYCOLAX Take 17 g by mouth daily.   PRENATAL VITAMINS PO Take by mouth.   ursodiol 500 MG tablet Commonly known as: ACTIGALL Take 1 tablet (500 mg total) by mouth 2 (two) times daily.   VITAMIN D-VITAMIN K PO Take by mouth.       Jorje Guild, NP 02/01/2019 4:09 PM

## 2019-02-01 NOTE — Discharge Instructions (Signed)
Fetal Movement Counts Patient Name: ________________________________________________ Patient Due Date: ____________________ What is a fetal movement count?  A fetal movement count is the number of times that you feel your baby move during a certain amount of time. This may also be called a fetal kick count. A fetal movement count is recommended for every pregnant woman. You may be asked to start counting fetal movements as early as week 28 of your pregnancy. Pay attention to when your baby is most active. You may notice your baby's sleep and wake cycles. You may also notice things that make your baby move more. You should do a fetal movement count:  When your baby is normally most active.  At the same time each day. A good time to count movements is while you are resting, after having something to eat and drink. How do I count fetal movements? 1. Find a quiet, comfortable area. Sit, or lie down on your side. 2. Write down the date, the start time and stop time, and the number of movements that you felt between those two times. Take this information with you to your health care visits. 3. Write down your start time when you feel the first movement. 4. Count kicks, flutters, swishes, rolls, and jabs. You should feel at least 10 movements. 5. You may stop counting after you have felt 10 movements, or if you have been counting for 2 hours. Write down the stop time. 6. If you do not feel 10 movements in 2 hours, contact your health care provider for further instructions. Your health care provider may want to do additional tests to assess your baby's well-being. Contact a health care provider if:  You feel fewer than 10 movements in 2 hours.  Your baby is not moving like he or she usually does. Date: ____________ Start time: ____________ Stop time: ____________ Movements: ____________ Date: ____________ Start time: ____________ Stop time: ____________ Movements: ____________ Date: ____________  Start time: ____________ Stop time: ____________ Movements: ____________ Date: ____________ Start time: ____________ Stop time: ____________ Movements: ____________ Date: ____________ Start time: ____________ Stop time: ____________ Movements: ____________ Date: ____________ Start time: ____________ Stop time: ____________ Movements: ____________ Date: ____________ Start time: ____________ Stop time: ____________ Movements: ____________ Date: ____________ Start time: ____________ Stop time: ____________ Movements: ____________ Date: ____________ Start time: ____________ Stop time: ____________ Movements: ____________ This information is not intended to replace advice given to you by your health care provider. Make sure you discuss any questions you have with your health care provider. Document Revised: 08/28/2018 Document Reviewed: 08/28/2018 Elsevier Patient Education  2020 [employer] Braxton Hicks Contractions Contractions of the uterus can occur throughout pregnancy, but they are not always a sign that you are in labor. You may have practice contractions called Braxton Hicks contractions. These false labor contractions are sometimes confused with true labor. What are Braxton Hicks contractions? Braxton Hicks contractions are tightening movements that occur in the muscles of the uterus before labor. Unlike true labor contractions, these contractions do not result in opening (dilation) and thinning of the cervix. Toward the end of pregnancy (32-34 weeks), Braxton Hicks contractions can happen more often and may become stronger. These contractions are sometimes difficult to tell apart from true labor because they can be very uncomfortable. You should not feel embarrassed if you go to the hospital with false labor. Sometimes, the only way to tell if you are in true labor is for your health care provider to look for changes in the cervix. The health care provider   will do a physical exam and may  monitor your contractions. If you are not in true labor, the exam should show that your cervix is not dilating and your water has not broken. If there are no other health problems associated with your pregnancy, it is completely safe for you to be sent home with false labor. You may continue to have Braxton Hicks contractions until you go into true labor. How to tell the difference between true labor and false labor True labor  Contractions last 30-70 seconds.  Contractions become very regular.  Discomfort is usually felt in the top of the uterus, and it spreads to the lower abdomen and low back.  Contractions do not go away with walking.  Contractions usually become more intense and increase in frequency.  The cervix dilates and gets thinner. False labor  Contractions are usually shorter and not as strong as true labor contractions.  Contractions are usually irregular.  Contractions are often felt in the front of the lower abdomen and in the groin.  Contractions may go away when you walk around or change positions while lying down.  Contractions get weaker and are shorter-lasting as time goes on.  The cervix usually does not dilate or become thin. Follow these instructions at home:   Take over-the-counter and prescription medicines only as told by your health care provider.  Keep up with your usual exercises and follow other instructions from your health care provider.  Eat and drink lightly if you think you are going into labor.  If Braxton Hicks contractions are making you uncomfortable: ? Change your position from lying down or resting to walking, or change from walking to resting. ? Sit and rest in a tub of warm water. ? Drink enough fluid to keep your urine pale yellow. Dehydration may cause these contractions. ? Do slow and deep breathing several times an hour.  Keep all follow-up prenatal visits as told by your health care provider. This is important. Contact a  health care provider if:  You have a fever.  You have continuous pain in your abdomen. Get help right away if:  Your contractions become stronger, more regular, and closer together.  You have fluid leaking or gushing from your vagina.  You pass blood-tinged mucus (bloody show).  You have bleeding from your vagina.  You have low back pain that you never had before.  You feel your baby's head pushing down and causing pelvic pressure.  Your baby is not moving inside you as much as it used to. Summary  Contractions that occur before labor are called Braxton Hicks contractions, false labor, or practice contractions.  Braxton Hicks contractions are usually shorter, weaker, farther apart, and less regular than true labor contractions. True labor contractions usually become progressively stronger and regular, and they become more frequent.  Manage discomfort from Braxton Hicks contractions by changing position, resting in a warm bath, drinking plenty of water, or practicing deep breathing. This information is not intended to replace advice given to you by your health care provider. Make sure you discuss any questions you have with your health care provider. Document Revised: 12/21/2016 Document Reviewed: 05/24/2016 Elsevier Patient Education  2020 Elsevier Inc.  

## 2019-02-03 ENCOUNTER — Inpatient Hospital Stay (HOSPITAL_COMMUNITY): Admission: AD | Admit: 2019-02-03 | Payer: 59 | Source: Home / Self Care | Admitting: Obstetrics

## 2019-02-04 ENCOUNTER — Ambulatory Visit (INDEPENDENT_AMBULATORY_CARE_PROVIDER_SITE_OTHER): Payer: 59 | Admitting: Obstetrics and Gynecology

## 2019-02-04 ENCOUNTER — Other Ambulatory Visit: Payer: Self-pay

## 2019-02-04 ENCOUNTER — Telehealth (HOSPITAL_COMMUNITY): Payer: Self-pay | Admitting: *Deleted

## 2019-02-04 ENCOUNTER — Other Ambulatory Visit (HOSPITAL_COMMUNITY)
Admission: RE | Admit: 2019-02-04 | Discharge: 2019-02-04 | Disposition: A | Discharge status: Home / Self Care | Payer: 59 | Source: Ambulatory Visit | Attending: Obstetrics and Gynecology | Admitting: Obstetrics and Gynecology

## 2019-02-04 ENCOUNTER — Encounter (HOSPITAL_COMMUNITY): Payer: Self-pay | Admitting: *Deleted

## 2019-02-04 VITALS — BP 113/73 | HR 103 | Temp 98.3°F | Wt 173.0 lb

## 2019-02-04 DIAGNOSIS — K831 Obstruction of bile duct: Secondary | ICD-10-CM

## 2019-02-04 DIAGNOSIS — Z3A34 34 weeks gestation of pregnancy: Secondary | ICD-10-CM | POA: Diagnosis not present

## 2019-02-04 DIAGNOSIS — O0993 Supervision of high risk pregnancy, unspecified, third trimester: Secondary | ICD-10-CM | POA: Diagnosis not present

## 2019-02-04 DIAGNOSIS — O99013 Anemia complicating pregnancy, third trimester: Secondary | ICD-10-CM

## 2019-02-04 DIAGNOSIS — O0990 Supervision of high risk pregnancy, unspecified, unspecified trimester: Secondary | ICD-10-CM | POA: Insufficient documentation

## 2019-02-04 DIAGNOSIS — Z6791 Unspecified blood type, Rh negative: Secondary | ICD-10-CM

## 2019-02-04 DIAGNOSIS — O26613 Liver and biliary tract disorders in pregnancy, third trimester: Secondary | ICD-10-CM

## 2019-02-04 DIAGNOSIS — Z86718 Personal history of other venous thrombosis and embolism: Secondary | ICD-10-CM

## 2019-02-04 DIAGNOSIS — R002 Palpitations: Secondary | ICD-10-CM

## 2019-02-04 DIAGNOSIS — O26899 Other specified pregnancy related conditions, unspecified trimester: Secondary | ICD-10-CM

## 2019-02-04 DIAGNOSIS — O360930 Maternal care for other rhesus isoimmunization, third trimester, not applicable or unspecified: Secondary | ICD-10-CM

## 2019-02-04 DIAGNOSIS — O26893 Other specified pregnancy related conditions, third trimester: Secondary | ICD-10-CM

## 2019-02-04 LAB — OB RESULTS CONSOLE GBS: GBS: NEGATIVE

## 2019-02-04 NOTE — Telephone Encounter (Signed)
Preadmission screen  

## 2019-02-04 NOTE — Progress Notes (Signed)
   PRENATAL VISIT NOTE  Subjective:  Sabrina Riddle is a 24 y.o. G1P0 at [redacted]w[redacted]d being seen today for ongoing prenatal care.  She is currently monitored for the following issues for this high-risk pregnancy and has Supervision of high risk pregnancy, antepartum; ADD (attention deficit disorder); History of DVT (deep vein thrombosis); Postural orthostatic tachycardia syndrome; Tachycardia; Rh negative state in antepartum period; Anemia affecting pregnancy in third trimester; and Cholestasis during pregnancy, antepartum on their problem list.  Patient reports pelvic pain and pressure, shortness of breath, palpitations..  Contractions: Irregular. Vag. Bleeding: None.  Movement: Present. Denies leaking of fluid.   The following portions of the patient's history were reviewed and updated as appropriate: allergies, current medications, past family history, past medical history, past social history, past surgical history and problem list.   Objective:   Vitals:   02/04/19 0954  BP: 113/73  Pulse: (!) 103  Temp: 98.3 F (36.8 C)  Weight: 173 lb (78.5 kg)    Fetal Status: Fetal Heart Rate (bpm): 136   Movement: Present     General:  Alert, oriented and cooperative. Patient is in no acute distress.  Skin: Skin is warm and dry. No rash noted.   Cardiovascular: Normal heart rate noted  Respiratory: Normal respiratory effort, no problems with respiration noted  Abdomen: Soft, gravid, appropriate for gestational age.  Pain/Pressure: Absent     Pelvic: Cervical exam deferred        Extremities: Normal range of motion.  Edema: None  Mental Status: Normal mood and affect. Normal behavior. Normal judgment and thought content.   Assessment and Plan:  Pregnancy: G1P0 at [redacted]w[redacted]d  1. Supervision of high risk pregnancy, antepartum Reviewed hospital policies regarding visitors, masking, COVID testing, etc. - Culture, beta strep (group b only) - Cervicovaginal ancillary only( [place])  2. History  of DVT (deep vein thrombosis) Cont lovenox until 1 day prior to delivery  3. Rh negative state in antepartum period S/p Rho gam  4. Anemia affecting pregnancy in third trimester  5. Cholestasis during pregnancy, antepartum - For delivery at 36 weeks - scheduled for BTMZ 48 hrs prior to induction - Cont weekly BPP  6. Palpitations - advised to come to hospital with any worsening symptoms - TSH today   Preterm labor symptoms and general obstetric precautions including but not limited to vaginal bleeding, contractions, leaking of fluid and fetal movement were reviewed in detail with the patient. Please refer to After Visit Summary for other counseling recommendations.   Return in about 1 week (around 02/11/2019) for high OB, virtual.  Future Appointments  Date Time Provider Department Center  02/06/2019  8:30 AM WH-MFC NURSE WH-MFC [facility]-US  02/06/2019  8:30 AM WH-MFC [place] 1 WH-MFCUS MFC-US  02/12/2019 10:30 AM [facility] NURSE [facility]  02/12/2019  1:30 PM MC-SCREENING MC-SDSC None  02/12/2019  2:15 PM Lesly Dukes, MD [facility]  02/13/2019  8:45 AM WH-MFC NURSE WH-MFC [facility]-US  02/13/2019  8:45 AM WH-MFC [place] 2 WH-MFCUS MFC-US  02/13/2019 10:45 AM [facility] NURSE [facility]  02/14/2019  7:55 AM MC-LD SCHED ROOM MC-INDC None  02/16/2019  9:00 AM Lesly Dukes, MD [facility]  02/18/2019  9:30 AM WH-MFC [place] 1 WH-MFCUS MFC-US  02/18/2019  9:40 AM WH-MFC NURSE WH-MFC MFC-US    Conan Bowens, MD

## 2019-02-05 LAB — CERVICOVAGINAL ANCILLARY ONLY
Chlamydia: NEGATIVE
Comment: NEGATIVE
Comment: NORMAL
Neisseria Gonorrhea: NEGATIVE

## 2019-02-05 LAB — TSH: TSH: 1.52 mIU/L

## 2019-02-06 ENCOUNTER — Other Ambulatory Visit: Payer: Self-pay

## 2019-02-06 ENCOUNTER — Ambulatory Visit (HOSPITAL_COMMUNITY): Payer: 59 | Admitting: *Deleted

## 2019-02-06 ENCOUNTER — Encounter (HOSPITAL_COMMUNITY): Payer: Self-pay

## 2019-02-06 ENCOUNTER — Ambulatory Visit (HOSPITAL_COMMUNITY)
Admission: RE | Admit: 2019-02-06 | Discharge: 2019-02-06 | Disposition: A | Discharge status: Home / Self Care | Payer: 59 | Source: Ambulatory Visit | Attending: Obstetrics and Gynecology | Admitting: Obstetrics and Gynecology

## 2019-02-06 DIAGNOSIS — O0990 Supervision of high risk pregnancy, unspecified, unspecified trimester: Secondary | ICD-10-CM

## 2019-02-06 DIAGNOSIS — Z3A34 34 weeks gestation of pregnancy: Secondary | ICD-10-CM

## 2019-02-06 DIAGNOSIS — O26619 Liver and biliary tract disorders in pregnancy, unspecified trimester: Secondary | ICD-10-CM | POA: Insufficient documentation

## 2019-02-06 DIAGNOSIS — O2233 Deep phlebothrombosis in pregnancy, third trimester: Secondary | ICD-10-CM

## 2019-02-06 DIAGNOSIS — K831 Obstruction of bile duct: Secondary | ICD-10-CM | POA: Diagnosis present

## 2019-02-06 DIAGNOSIS — O99013 Anemia complicating pregnancy, third trimester: Secondary | ICD-10-CM | POA: Diagnosis present

## 2019-02-06 DIAGNOSIS — O26613 Liver and biliary tract disorders in pregnancy, third trimester: Secondary | ICD-10-CM | POA: Diagnosis present

## 2019-02-06 DIAGNOSIS — O26643 Intrahepatic cholestasis of pregnancy, third trimester: Secondary | ICD-10-CM

## 2019-02-06 DIAGNOSIS — O0993 Supervision of high risk pregnancy, unspecified, third trimester: Secondary | ICD-10-CM | POA: Diagnosis not present

## 2019-02-07 LAB — CULTURE, BETA STREP (GROUP B ONLY)
MICRO NUMBER:: 10037723
SPECIMEN QUALITY:: ADEQUATE

## 2019-02-09 ENCOUNTER — Other Ambulatory Visit: Payer: Self-pay | Admitting: Family Medicine

## 2019-02-09 NOTE — Progress Notes (Signed)
Admission orders placed  Marlowe Alt, DO [facility], [facility] 02/09/2019 9:02 PM

## 2019-02-10 ENCOUNTER — Encounter: Payer: Self-pay | Admitting: *Deleted

## 2019-02-12 ENCOUNTER — Telehealth (INDEPENDENT_AMBULATORY_CARE_PROVIDER_SITE_OTHER): Payer: 59 | Admitting: Obstetrics & Gynecology

## 2019-02-12 ENCOUNTER — Ambulatory Visit (INDEPENDENT_AMBULATORY_CARE_PROVIDER_SITE_OTHER): Payer: 59 | Admitting: *Deleted

## 2019-02-12 ENCOUNTER — Encounter: Payer: Self-pay | Admitting: Obstetrics & Gynecology

## 2019-02-12 ENCOUNTER — Other Ambulatory Visit: Payer: Self-pay

## 2019-02-12 ENCOUNTER — Other Ambulatory Visit (HOSPITAL_COMMUNITY)
Admission: RE | Admit: 2019-02-12 | Discharge: 2019-02-12 | Disposition: A | Discharge status: Home / Self Care | Payer: 59 | Source: Ambulatory Visit | Attending: Obstetrics and Gynecology | Admitting: Obstetrics and Gynecology

## 2019-02-12 VITALS — BP 109/74 | HR 99 | Wt 176.0 lb

## 2019-02-12 DIAGNOSIS — O26613 Liver and biliary tract disorders in pregnancy, third trimester: Secondary | ICD-10-CM

## 2019-02-12 DIAGNOSIS — O26619 Liver and biliary tract disorders in pregnancy, unspecified trimester: Secondary | ICD-10-CM

## 2019-02-12 DIAGNOSIS — K831 Obstruction of bile duct: Secondary | ICD-10-CM

## 2019-02-12 DIAGNOSIS — Z3A35 35 weeks gestation of pregnancy: Secondary | ICD-10-CM

## 2019-02-12 DIAGNOSIS — Z86718 Personal history of other venous thrombosis and embolism: Secondary | ICD-10-CM

## 2019-02-12 LAB — SARS CORONAVIRUS 2 (TAT 6-24 HRS): SARS Coronavirus 2: NEGATIVE

## 2019-02-12 MED ORDER — BETAMETHASONE SOD PHOS & ACET 6 (3-3) MG/ML IJ SUSP
12.0000 mg | INTRAMUSCULAR | Status: AC
  Administered 2019-02-12 – 2019-02-13 (×2): 12 mg via INTRAMUSCULAR

## 2019-02-12 NOTE — Progress Notes (Signed)
   TELEHEALTH VIRTUAL OBSTETRICS VISIT ENCOUNTER NOTE  I connected with Carmela Piechowski on 02/12/19 at  25:15 PM EST by telephone at home and verified that I am speaking with the correct person using two identifiers.   I discussed the limitations, risks, security and privacy concerns of performing an evaluation and management service by telephone and the availability of in person appointments. I also discussed with the patient that there may be a patient responsible charge related to this service. The patient expressed understanding and agreed to proceed.  Subjective:  Rianna Lukes is a 24 y.o. G1P0 at [redacted]w[redacted]d being followed for ongoing prenatal care.  She is currently monitored for the following issues for this high-risk pregnancy and has Supervision of high risk pregnancy, antepartum; ADD (attention deficit disorder); History of DVT (deep vein thrombosis); Postural orthostatic tachycardia syndrome; Tachycardia; Rh negative state in antepartum period; Anemia affecting pregnancy in third trimester; and Cholestasis during pregnancy, antepartum on their problem list.  Patient reports no complaints. Reports fetal movement. Denies any contractions, bleeding or leaking of fluid.   The following portions of the patient's history were reviewed and updated as appropriate: allergies, current medications, past family history, past medical history, past social history, past surgical history and problem list.   Objective:   General:  Alert, oriented and cooperative.   Mental Status: Normal mood and affect perceived. Normal judgment and thought content.  Rest of physical exam deferred due to type of encounter  Assessment and Plan:  Pregnancy: G1P0 at [redacted]w[redacted]d 1. Cholestasis during pregnancy, antepartum Cont Actigall.  Has last MFM antenatal appt on Friday. Received betamethasone today and rpt tomorrow for planned 36 week delivery.    2. History of DVT (deep vein thrombosis) Take Lovenox tonight  then stop.  Will be fine for induction Saturday morning.    Preterm labor symptoms and general obstetric precautions including but not limited to vaginal bleeding, contractions, leaking of fluid and fetal movement were reviewed in detail with the patient.  I discussed the assessment and treatment plan with the patient. The patient was provided an opportunity to ask questions and all were answered. The patient agreed with the plan and demonstrated an understanding of the instructions. The patient was advised to call back or seek an in-person office evaluation/go to [facility] at [facility] for any urgent or concerning symptoms. Please refer to After Visit Summary for other counseling recommendations.   I provided 15 minutes of non-face-to-face time during this encounter.  No follow-ups on file.  Future Appointments  Date Time Provider Department Center  02/13/2019  8:45 AM WH-MFC NURSE WH-MFC MFC-US  02/13/2019  8:45 AM WH-MFC [place] 2 WH-MFCUS MFC-US  02/13/2019 10:45 AM [facility] NURSE [facility]  02/14/2019  7:55 AM MC-LD SCHED ROOM MC-INDC None    Elsie Lincoln, MD [facility] for [place], [facility]

## 2019-02-12 NOTE — Progress Notes (Signed)
Pt here fir first Betamethasone injection of 12mg  IM.

## 2019-02-13 ENCOUNTER — Ambulatory Visit (HOSPITAL_BASED_OUTPATIENT_CLINIC_OR_DEPARTMENT_OTHER)
Admission: RE | Admit: 2019-02-13 | Discharge: 2019-02-13 | Disposition: A | Discharge status: Home / Self Care | Payer: 59 | Source: Ambulatory Visit | Attending: Obstetrics and Gynecology | Admitting: Obstetrics and Gynecology

## 2019-02-13 ENCOUNTER — Other Ambulatory Visit: Payer: Self-pay

## 2019-02-13 ENCOUNTER — Ambulatory Visit (HOSPITAL_COMMUNITY): Payer: 59 | Admitting: *Deleted

## 2019-02-13 ENCOUNTER — Ambulatory Visit (INDEPENDENT_AMBULATORY_CARE_PROVIDER_SITE_OTHER): Payer: 59 | Admitting: *Deleted

## 2019-02-13 ENCOUNTER — Other Ambulatory Visit (HOSPITAL_COMMUNITY): Payer: Self-pay | Admitting: Obstetrics

## 2019-02-13 ENCOUNTER — Encounter (HOSPITAL_COMMUNITY): Payer: Self-pay

## 2019-02-13 DIAGNOSIS — O99013 Anemia complicating pregnancy, third trimester: Secondary | ICD-10-CM | POA: Insufficient documentation

## 2019-02-13 DIAGNOSIS — O26613 Liver and biliary tract disorders in pregnancy, third trimester: Secondary | ICD-10-CM

## 2019-02-13 DIAGNOSIS — Z362 Encounter for other antenatal screening follow-up: Secondary | ICD-10-CM

## 2019-02-13 DIAGNOSIS — Z3A35 35 weeks gestation of pregnancy: Secondary | ICD-10-CM

## 2019-02-13 DIAGNOSIS — O2233 Deep phlebothrombosis in pregnancy, third trimester: Secondary | ICD-10-CM | POA: Insufficient documentation

## 2019-02-13 DIAGNOSIS — K831 Obstruction of bile duct: Secondary | ICD-10-CM | POA: Insufficient documentation

## 2019-02-13 DIAGNOSIS — Z01812 Encounter for preprocedural laboratory examination: Secondary | ICD-10-CM | POA: Insufficient documentation

## 2019-02-13 DIAGNOSIS — O0990 Supervision of high risk pregnancy, unspecified, unspecified trimester: Secondary | ICD-10-CM

## 2019-02-13 DIAGNOSIS — Z20822 Contact with and (suspected) exposure to covid-19: Secondary | ICD-10-CM | POA: Insufficient documentation

## 2019-02-13 DIAGNOSIS — O26619 Liver and biliary tract disorders in pregnancy, unspecified trimester: Secondary | ICD-10-CM

## 2019-02-13 NOTE — Progress Notes (Signed)
Pt here for 2nd Betamethasone

## 2019-02-14 ENCOUNTER — Inpatient Hospital Stay (HOSPITAL_COMMUNITY): Payer: 59

## 2019-02-14 ENCOUNTER — Encounter (HOSPITAL_COMMUNITY): Payer: Self-pay | Admitting: Family Medicine

## 2019-02-14 ENCOUNTER — Inpatient Hospital Stay (HOSPITAL_COMMUNITY): Payer: 59 | Admitting: Anesthesiology

## 2019-02-14 ENCOUNTER — Inpatient Hospital Stay (HOSPITAL_COMMUNITY)
Admission: AD | Admit: 2019-02-14 | Discharge: 2019-02-17 | DRG: 805 | Disposition: A | Discharge status: Home / Self Care | Payer: 59 | Attending: Family Medicine | Admitting: Family Medicine

## 2019-02-14 DIAGNOSIS — Z01812 Encounter for preprocedural laboratory examination: Secondary | ICD-10-CM

## 2019-02-14 DIAGNOSIS — Z3490 Encounter for supervision of normal pregnancy, unspecified, unspecified trimester: Secondary | ICD-10-CM | POA: Diagnosis present

## 2019-02-14 DIAGNOSIS — K831 Obstruction of bile duct: Secondary | ICD-10-CM

## 2019-02-14 DIAGNOSIS — D649 Anemia, unspecified: Secondary | ICD-10-CM | POA: Diagnosis present

## 2019-02-14 DIAGNOSIS — Z86711 Personal history of pulmonary embolism: Secondary | ICD-10-CM | POA: Diagnosis not present

## 2019-02-14 DIAGNOSIS — Z3A36 36 weeks gestation of pregnancy: Secondary | ICD-10-CM | POA: Diagnosis not present

## 2019-02-14 DIAGNOSIS — L299 Pruritus, unspecified: Secondary | ICD-10-CM | POA: Diagnosis present

## 2019-02-14 DIAGNOSIS — O99013 Anemia complicating pregnancy, third trimester: Secondary | ICD-10-CM

## 2019-02-14 DIAGNOSIS — O0990 Supervision of high risk pregnancy, unspecified, unspecified trimester: Secondary | ICD-10-CM

## 2019-02-14 DIAGNOSIS — O26619 Liver and biliary tract disorders in pregnancy, unspecified trimester: Secondary | ICD-10-CM

## 2019-02-14 DIAGNOSIS — O26893 Other specified pregnancy related conditions, third trimester: Secondary | ICD-10-CM | POA: Diagnosis present

## 2019-02-14 DIAGNOSIS — Z7901 Long term (current) use of anticoagulants: Secondary | ICD-10-CM | POA: Diagnosis not present

## 2019-02-14 DIAGNOSIS — Z6791 Unspecified blood type, Rh negative: Secondary | ICD-10-CM

## 2019-02-14 DIAGNOSIS — R079 Chest pain, unspecified: Secondary | ICD-10-CM

## 2019-02-14 DIAGNOSIS — O360930 Maternal care for other rhesus isoimmunization, third trimester, not applicable or unspecified: Secondary | ICD-10-CM | POA: Diagnosis not present

## 2019-02-14 DIAGNOSIS — I498 Other specified cardiac arrhythmias: Secondary | ICD-10-CM | POA: Diagnosis present

## 2019-02-14 DIAGNOSIS — Z86718 Personal history of other venous thrombosis and embolism: Secondary | ICD-10-CM | POA: Diagnosis not present

## 2019-02-14 DIAGNOSIS — O2662 Liver and biliary tract disorders in childbirth: Secondary | ICD-10-CM | POA: Diagnosis present

## 2019-02-14 DIAGNOSIS — O9902 Anemia complicating childbirth: Secondary | ICD-10-CM | POA: Diagnosis present

## 2019-02-14 DIAGNOSIS — Z20822 Contact with and (suspected) exposure to covid-19: Secondary | ICD-10-CM | POA: Diagnosis present

## 2019-02-14 LAB — CBC
HCT: 31.7 % — ABNORMAL LOW (ref 36.0–46.0)
Hemoglobin: 10.6 g/dL — ABNORMAL LOW (ref 12.0–15.0)
MCH: 30.9 pg (ref 26.0–34.0)
MCHC: 33.4 g/dL (ref 30.0–36.0)
MCV: 92.4 fL (ref 80.0–100.0)
Platelets: 280 10*3/uL (ref 150–400)
RBC: 3.43 MIL/uL — ABNORMAL LOW (ref 3.87–5.11)
RDW: 13 % (ref 11.5–15.5)
WBC: 10.8 10*3/uL — ABNORMAL HIGH (ref 4.0–10.5)
nRBC: 0 % (ref 0.0–0.2)

## 2019-02-14 LAB — RPR: RPR Ser Ql: NONREACTIVE

## 2019-02-14 MED ORDER — EPHEDRINE 5 MG/ML INJ: 10.0000 mg | INTRAVENOUS | Status: DC | PRN

## 2019-02-14 MED ORDER — LIDOCAINE HCL 1 % IJ SOLN
INTRAMUSCULAR | Status: AC
  Filled 2019-02-14: qty 20

## 2019-02-14 MED ORDER — OXYTOCIN 40 UNITS IN NORMAL SALINE INFUSION - SIMPLE MED
2.5000 [IU]/h | INTRAVENOUS | Status: DC
  Administered 2019-02-15: 2.5 [IU]/h via INTRAVENOUS
  Filled 2019-02-14: qty 1000

## 2019-02-14 MED ORDER — OXYTOCIN BOLUS FROM INFUSION
500.0000 mL | Freq: Once | INTRAVENOUS | Status: AC
  Administered 2019-02-15: 500 mL via INTRAVENOUS

## 2019-02-14 MED ORDER — ZOLPIDEM TARTRATE 5 MG PO TABS
5.0000 mg | ORAL_TABLET | Freq: Every evening | ORAL | Status: DC | PRN
  Administered 2019-02-14: 5 mg via ORAL
  Filled 2019-02-14: qty 1

## 2019-02-14 MED ORDER — URSODIOL 300 MG PO CAPS
600.0000 mg | ORAL_CAPSULE | Freq: Two times a day (BID) | ORAL | Status: DC
  Administered 2019-02-14 (×2): 600 mg via ORAL
  Filled 2019-02-14 (×5): qty 2

## 2019-02-14 MED ORDER — TERBUTALINE SULFATE 1 MG/ML IJ SOLN: 0.2500 mg | Freq: Once | INTRAMUSCULAR | Status: DC | PRN

## 2019-02-14 MED ORDER — ONDANSETRON HCL 4 MG/2ML IJ SOLN
4.0000 mg | Freq: Four times a day (QID) | INTRAMUSCULAR | Status: DC | PRN
  Administered 2019-02-14 – 2019-02-15 (×2): 4 mg via INTRAVENOUS
  Filled 2019-02-14 (×2): qty 2

## 2019-02-14 MED ORDER — LIDOCAINE HCL (PF) 1 % IJ SOLN
INTRAMUSCULAR | Status: DC | PRN
  Administered 2019-02-14: 5 mL via EPIDURAL

## 2019-02-14 MED ORDER — LIDOCAINE HCL (PF) 1 % IJ SOLN: 30.0000 mL | INTRAMUSCULAR | Status: DC | PRN

## 2019-02-14 MED ORDER — LACTATED RINGERS IV SOLN: INTRAVENOUS | Status: DC

## 2019-02-14 MED ORDER — MISOPROSTOL 50MCG HALF TABLET
50.0000 ug | ORAL_TABLET | ORAL | Status: DC | PRN
  Administered 2019-02-14 (×2): 50 ug via BUCCAL
  Filled 2019-02-14 (×2): qty 1

## 2019-02-14 MED ORDER — LACTATED RINGERS IV SOLN: 500.0000 mL | Freq: Once | INTRAVENOUS | Status: DC

## 2019-02-14 MED ORDER — PHENYLEPHRINE 40 MCG/ML (10ML) SYRINGE FOR IV PUSH (FOR BLOOD PRESSURE SUPPORT)
PREFILLED_SYRINGE | INTRAVENOUS | Status: AC
  Filled 2019-02-14: qty 10

## 2019-02-14 MED ORDER — DIPHENHYDRAMINE HCL 50 MG/ML IJ SOLN: 12.5000 mg | INTRAMUSCULAR | Status: DC | PRN

## 2019-02-14 MED ORDER — OXYTOCIN 40 UNITS IN NORMAL SALINE INFUSION - SIMPLE MED: 1.0000 m[IU]/min | INTRAVENOUS | Status: DC

## 2019-02-14 MED ORDER — SOD CITRATE-CITRIC ACID 500-334 MG/5ML PO SOLN: 30.0000 mL | ORAL | Status: DC | PRN

## 2019-02-14 MED ORDER — PHENYLEPHRINE 40 MCG/ML (10ML) SYRINGE FOR IV PUSH (FOR BLOOD PRESSURE SUPPORT): 80.0000 ug | PREFILLED_SYRINGE | INTRAVENOUS | Status: DC | PRN

## 2019-02-14 MED ORDER — SODIUM CHLORIDE (PF) 0.9 % IJ SOLN
INTRAMUSCULAR | Status: DC | PRN
  Administered 2019-02-14: 12 mL/h via EPIDURAL

## 2019-02-14 MED ORDER — MISOPROSTOL 25 MCG QUARTER TABLET
25.0000 ug | ORAL_TABLET | ORAL | Status: DC | PRN
  Administered 2019-02-14 (×2): 25 ug via VAGINAL
  Filled 2019-02-14 (×2): qty 1

## 2019-02-14 MED ORDER — FENTANYL CITRATE (PF) 100 MCG/2ML IJ SOLN
50.0000 ug | INTRAMUSCULAR | Status: DC | PRN
  Administered 2019-02-14: 100 ug via INTRAVENOUS
  Filled 2019-02-14 (×2): qty 2

## 2019-02-14 MED ORDER — OXYTOCIN 40 UNITS IN NORMAL SALINE INFUSION - SIMPLE MED
1.0000 m[IU]/min | INTRAVENOUS | Status: DC
  Administered 2019-02-14: 2 m[IU]/min via INTRAVENOUS
  Filled 2019-02-14: qty 1000

## 2019-02-14 MED ORDER — FENTANYL-BUPIVACAINE-NACL 0.5-0.125-0.9 MG/250ML-% EP SOLN
12.0000 mL/h | EPIDURAL | Status: DC | PRN
  Administered 2019-02-15: 12 mL/h via EPIDURAL
  Filled 2019-02-14: qty 250

## 2019-02-14 MED ORDER — ACETAMINOPHEN 325 MG PO TABS
650.0000 mg | ORAL_TABLET | ORAL | Status: DC | PRN
  Administered 2019-02-15: 650 mg via ORAL
  Filled 2019-02-14: qty 2

## 2019-02-14 MED ORDER — LACTATED RINGERS IV SOLN: 500.0000 mL | INTRAVENOUS | Status: DC | PRN

## 2019-02-14 MED ORDER — FENTANYL-BUPIVACAINE-NACL 0.5-0.125-0.9 MG/250ML-% EP SOLN
EPIDURAL | Status: AC
  Filled 2019-02-14: qty 250

## 2019-02-14 NOTE — Progress Notes (Signed)
Fentanyl 50 mcg brought into room.  Pt instructed on side effects and effects on infant.  Pt decided not to take the Fentanyl

## 2019-02-14 NOTE — Progress Notes (Signed)
Labor Progress Note Sabrina Riddle is a 24 y.o. G1P0 at [redacted]w[redacted]d presented for IOL for cholestasis  S:  Patient reporting worsening pain. Lying in bed.  O:  BP 111/73 (BP Location: Right Arm)   Pulse 80   Temp 98.2 F (36.8 C) (Axillary)   Resp 18   Ht 5\' 4"  (1.626 m)   Wt 80.5 kg   LMP 06/07/2018   BMI 30.47 kg/m   Fetal Tracing:  Baseline: 120 Variability: moderate Accels: 15x15 Decels: none  Toco: 3-5   CVE: Dilation: Fingertip Effacement (%): 60 Station: -2 Presentation: Vertex Exam by:: [email or web address], CNM   A&P: 24 y.o. G1P0 [redacted]w[redacted]d IOL cholestasis #Labor: FB in place. Continue cytotec #Pain: IV fentanyl #FWB: Cat 1 #GBS negative  [redacted]w[redacted]d, CNM 3:03 PM

## 2019-02-14 NOTE — Progress Notes (Signed)
Labor Progress Note Sabrina Riddle is a 24 y.o. G1P0 at [redacted]w[redacted]d presented for IOL d/t cholestasis S: Patient sitting comfortably in bed, epidural in place  O:  BP (!) 100/57   Pulse (!) 54   Temp 98.1 F (36.7 C) (Oral)   Resp 16   Ht 5\' 4"  (1.626 m)   Wt 80.5 kg   LMP 06/07/2018   BMI 30.47 kg/m  EFM: 120 bpm/+accels/-decels  CVE: Dilation: 5 Effacement (%): 50 Station: -2 Presentation: Vertex Exam by:: Tomie Spizzirri   A&P: 24 y.o. G1P0 [redacted]w[redacted]d here for IOL d/t cholestasis #Labor: Progressing well. Pitocin at 58mu/min. Will continue to monitor. #Pain: Epidural in place #FWB: Cat I #GBS negative  13m, MD 11:08 PM

## 2019-02-14 NOTE — Progress Notes (Signed)
Sabrina Riddle is a 24 y.o. G1P0 at [redacted]w[redacted]d by ultrasound admitted for induction of labor due to cholestasis.  Subjective: Doing well, would like to eat something.  Diet order changed.  Mild contractions, feels a small amount of cramping.  No complaints  Objective: BP 112/78   Pulse (!) 59   Temp 97.8 F (36.6 C) (Oral)   Resp 16   Ht 5\' 4"  (1.626 m)   Wt 80.5 kg   LMP 06/07/2018   BMI 30.47 kg/m  No intake/output data recorded. No intake/output data recorded.  FHT:  FHR: 120 bpm, variability: moderate,  accelerations:  Present,  decelerations:  Absent UC:   irregular, every 4-6 minutes SVE:   Dilation: Fingertip Effacement (%): 50 Station: -2 Exam by:: [email or web address], RN  Labs: Lab Results  Component Value Date   WBC 10.8 (H) 02/14/2019   HGB 10.6 (L) 02/14/2019   HCT 31.7 (L) 02/14/2019   MCV 92.4 02/14/2019   PLT 280 02/14/2019    Assessment / Plan: IOL for cholestasis   -Foley bulb placed with 60 cc normal saline. -Administer Cytotec 50 mcg buccal at 10 AM -SVE once foley bulb comes out -May have fentanyl PRN  Labor: Latent phase Preeclampsia:  Denies  Fetal Wellbeing:  Category I Pain Control:  IV pain meds I/D:  n/a Anticipated MOD:  NSVD  June, SNM 02/14/2019, 9:56 AM

## 2019-02-14 NOTE — Anesthesia Procedure Notes (Signed)
Epidural Patient location during procedure: OB Start time: 02/14/2019 4:58 PM End time: 02/14/2019 5:10 PM  Staffing Anesthesiologist: Trevor Iha, MD Performed: anesthesiologist   Preanesthetic Checklist Completed: patient identified, IV checked, site marked, risks and benefits discussed, surgical consent, monitors and equipment checked, pre-op evaluation and timeout performed  Epidural Patient position: sitting Prep: DuraPrep and site prepped and draped Patient monitoring: continuous pulse ox and blood pressure Approach: midline Location: L2-L3 Injection technique: LOR air  Needle:  Needle type: Tuohy  Needle gauge: 17 G Needle length: 9 cm and 9 Needle insertion depth: 6 cm Catheter type: closed end flexible Catheter size: 19 Gauge Catheter at skin depth: 11 cm Test dose: negative  Assessment Events: blood not aspirated, injection not painful, no injection resistance, no paresthesia and negative IV test  Additional Notes Patient identified. Risks/Benefits/Options discussed with patient including but not limited to bleeding, infection, nerve damage, paralysis, failed block, incomplete pain control, headache, blood pressure changes, nausea, vomiting, reactions to medication both or allergic, itching and postpartum back pain. Confirmed with bedside nurse the patient's most recent platelet count. Confirmed with patient that they are not currently taking any anticoagulation, have any bleeding history or any family history of bleeding disorders. Patient expressed understanding and wished to proceed. All questions were answered. Sterile technique was used throughout the entire procedure. Please see nursing notes for vital signs. Test dose was given through epidural needle and negative prior to continuing to dose epidural or start infusion. Warning signs of high block given to the patient including shortness of breath, tingling/numbness in hands, complete motor block, or any  concerning symptoms with instructions to call for help. Patient was given instructions on fall risk and not to get out of bed. All questions and concerns addressed with instructions to call with any issues.1  Attempt (S) . Patient tolerated procedure well.

## 2019-02-14 NOTE — Progress Notes (Addendum)
Labor Progress Note Christmas Faraci is a 24 y.o. G1P0 at [redacted]w[redacted]d presented for IOL d/t cholestasis S: patient resting comfortably  O:  BP 119/74   Pulse 82   Temp 98 F (36.7 C) (Oral)   Resp 16   Ht 5\' 4"  (1.626 m)   Wt 80.5 kg   LMP 06/07/2018   BMI 30.47 kg/m  EFM: 120 bpm/+accels/-decels  CVE: Dilation: Fingertip Effacement (%): 50 Station: -2 Presentation: Vertex Exam by:: [email or web address], RN   A&P: 24 y.o. G1P0 [redacted]w[redacted]d here for IOL d/t cholestasis #Labor: Progressing well. Cytotec #2 placed. Effacement improving, likely will be able to place FB at next check, 1000. #Pain: Per patient request #FWB: Cat 1 #GBS negative #Rh neg- eval after delivery #H/O DVT- restart lovenox pp #VitK- patient does not want  [redacted]w[redacted]d, MD 6:41 AM

## 2019-02-14 NOTE — Progress Notes (Signed)
Sabrina Riddle is a 24 y.o. G1P0 at [redacted]w[redacted]d by ultrasound admitted for induction of labor due to cholestasis.  Subjective: Lying in bed. Feeling contractions, states they are becoming stronger.  Would like an epidural.  Objective: BP 111/73 (BP Location: Right Arm)   Pulse 80   Temp 98.2 F (36.8 C) (Axillary)   Resp 18   Ht 5\' 4"  (1.626 m)   Wt 80.5 kg   LMP 06/07/2018   BMI 30.47 kg/m  No intake/output data recorded. No intake/output data recorded.  FHT:  FHR: 125 bpm, variability: moderate,  accelerations:  Present,  decelerations:  Absent UC:   regular, every 3-5 minutes SVE:   Dilation: 4 Effacement (%): 50 Station: -3 Exam by:: Aurea Aronov hallam,snm  Labs: Lab Results  Component Value Date   WBC 10.8 (H) 02/14/2019   HGB 10.6 (L) 02/14/2019   HCT 31.7 (L) 02/14/2019   MCV 92.4 02/14/2019   PLT 280 02/14/2019    Assessment / Plan: IOL for cholestasis  -Start pitocin at 2 milli units and increase by 2 milli units every 30 minutes to achieve contractions that are every 2-3 minutes and that are moderate in intensity. -Continuous fetal monitoring. -May have epidural -SVE PRN  Labor: Latent phase Preeclampsia:  Denies signs and symptoms Fetal Wellbeing:  Category I Pain Control:  Epidural I/D:  n/a Anticipated MOD:  NSVD  02/16/2019, SNM 02/14/2019, 4:39 PM

## 2019-02-14 NOTE — Anesthesia Preprocedure Evaluation (Addendum)
Anesthesia Evaluation  Patient identified by MRN, date of birth, ID band Patient awake    Reviewed: Allergy & Precautions, Patient's Chart, lab work & pertinent test results  Airway Mallampati: II  TM Distance: >3 FB Neck ROM: Full    Dental no notable dental hx. (+) Teeth Intact   Pulmonary neg pulmonary ROS,    Pulmonary exam normal breath sounds clear to auscultation       Cardiovascular + DVT  Normal cardiovascular exam Rhythm:Regular Rate:Normal  Hx of POTS syndrome  Hx of Bicuspid AV   Neuro/Psych negative neurological ROS     GI/Hepatic negative GI ROS, Being induced for Cholecystasis   Endo/Other  negative endocrine ROS  Renal/GU negative Renal ROS     Musculoskeletal Hgb 10.6 plt 280   Abdominal   Peds  Hematology  (+) anemia , Hgb 10.6 Plt 280   Anesthesia Other Findings   Reproductive/Obstetrics (+) Pregnancy                            Anesthesia Physical Anesthesia Plan  ASA: III  Anesthesia Plan: Epidural   Post-op Pain Management:    Induction:   PONV Risk Score and Plan:   Airway Management Planned:   Additional Equipment:   Intra-op Plan:   Post-operative Plan:   Informed Consent: I have reviewed the patients History and Physical, chart, labs and discussed the procedure including the risks, benefits and alternatives for the proposed anesthesia with the patient or authorized representative who has indicated his/her understanding and acceptance.     Dental advisory given  Plan Discussed with:   Anesthesia Plan Comments: (36 wk G1PO w hx of POTS, DVT ( off lovenox since Thursday) and cholestais for LEA)        Anesthesia Quick Evaluation

## 2019-02-14 NOTE — H&P (Addendum)
OBSTETRIC ADMISSION HISTORY AND PHYSICAL  Sabrina Riddle is a 24 y.o. female G2P0 with IUP at 71w0dby early [place] for I[place] d/t cholestasis. She reports +FMs, No LOF, no VB, no blurry vision, headaches or peripheral edema, and RUQ pain.  She plans on breast feeding. She declines contraception. [facility]   Dating: By first trimester [place](transferred care at [facility] --->  Estimated Date of Delivery: 03/14/19  Sono:    _0 , CWD, normal anatomy, cephalic presentation, posterior placental lie, 275g _1 , CWD, normal anatomy, cephalic presentation, posterior fundal placental lie, 2527g, 23% EFW  Prenatal History/Complications: Fertility treatment- used clomid, femara, metformin to conceive Cholestasis H/O DVT- on lovenox Rh negative- received Rhogam Anemia H/O bicuspid aortic valve POTS ADD Past Medical History: Past Medical History:  Diagnosis Date   ADD (attention deficit disorder)    Arcuate uterus    Bicuspid aortic valve    Cholestasis of pregnancy    History of DVT (deep vein thrombosis)    POTS (postural orthostatic tachycardia syndrome)    Pulmonary embolism (HCC)    Vitamin B12 deficiency     Past Surgical History: Past Surgical History:  Procedure Laterality Date   TYMPANOSTOMY     WISDOM TOOTH EXTRACTION     WRIST SURGERY      Obstetrical History: OB History     Gravida  1   Para      Term      Preterm      AB      Living         SAB      TAB      Ectopic      Multiple      Live Births              Social History: Social History   Socioeconomic History   Marital status: Married    Spouse name: Not on file   Number of children: Not on file   Years of education: Not on file   Highest education level: Not on file  Occupational History   Not on file  Tobacco Use   Smoking status: Never Smoker   Smokeless tobacco: Never Used  Substance and Sexual Activity   Alcohol use: No   Drug  use: No   Sexual activity: Yes    Birth control/protection: None  Other Topics Concern   Not on file  Social History Narrative   Not on file   Social Determinants of Health   Financial Resource Strain:    Difficulty of Paying Living Expenses: Not on file  Food Insecurity:    Worried About RCharity fundraiserin the Last Year: Not on file   RYRC Worldwideof Food in the Last Year: Not on file  Transportation Needs:    Lack of Transportation (Medical): Not on file   Lack of Transportation (Non-Medical): Not on file  Physical Activity:    Days of Exercise per Week: Not on file   Minutes of Exercise per Session: Not on file  Stress:    Feeling of Stress : Not on file  Social Connections:    Frequency of Communication with Friends and Family: Not on file   Frequency of Social Gatherings with Friends and Family: Not on file   Attends Religious Services: Not on file   Active Member of Clubs or Organizations: Not on file   Attends CArchivistMeetings: Not on file  Marital Status: Not on file    Family History: Family History  Problem Relation Age of Onset   Hypertension Mother    Polycystic ovary syndrome Mother    Kidney cancer Mother    Diabetes Father    Testicular cancer Father    Polycystic ovary syndrome Sister    Aneurysm Maternal Grandmother    Aneurysm Paternal Grandmother     Allergies: Allergies  Allergen Reactions   Methylphenidate Itching   Morphine Nausea And Vomiting   Concerta [Methylphenidate Hcl Er (Cd)]    Dexmethylphenidate Itching   Hydrocodone Nausea And Vomiting   Penicillins Other (See Comments)    Reaction unknown   Vyvanse [Lisdexamfetamine Dimesylate]     Medications Prior to Admission  Medication Sig Dispense Refill Last Dose   Blood Pressure Monitoring (BLOOD PRESSURE KIT) DEVI 1 Device by Does not apply route once a week. To use device to obtain BP weekly for BRX 1 Device 0    Docusate Sodium (DSS) 100 MG CAPS Take by mouth.       enoxaparin (LOVENOX) 40 MG/0.4ML injection       Lactulose 20 GM/30ML SOLN Take 30 cc daily and titrate to bowel movement. (Patient not taking: Reported on 02/13/2019) 450 mL 0    magnesium oxide (MAG-OX) 400 MG tablet Take 400 mg by mouth daily.      Misc. Devices (BREAST PUMP) MISC 1 Device by Does not apply route daily. 1 each 0    polyethylene glycol (MIRALAX / GLYCOLAX) 17 g packet Take 17 g by mouth daily.      Prenatal Vit-Fe Fumarate-FA (PRENATAL VITAMINS PO) Take by mouth.      ursodiol (ACTIGALL) 500 MG tablet Take 1 tablet (500 mg total) by mouth 2 (two) times daily. 60 tablet 3      Review of Systems   All systems reviewed and negative except as stated in HPI  Blood pressure 117/67, pulse (!) 109, temperature 98.6 F (37 C), temperature source Oral, resp. rate 18, height _0  (1.626 m), weight 80.5 kg, last menstrual period 06/07/2018. General appearance: alert, cooperative, appears stated age and no distress Lungs: normal effort Heart: regular rate  Abdomen: soft, non-tender; bowel sounds normal Pelvic: gravid uterus GU: No vaginal lesions  Extremities: Homans sign is negative, no sign of DVT DTR's intact Presentation: cephalic Fetal monitoringBaseline: 120 bpm, Variability: Good {> 6 bpm) and Accelerations: Reactive Uterine activity: None Dilation: Fingertip Effacement (%): Thick Station: -3 Exam by:: Dr. Chauncey Reading   Prenatal labs: ABO, Rh:   Antibody: NO ANTIBODIES DETECTED (11/23 0914) Rubella:   RPR: NON-REACTIVE (11/23 0914)  HBsAg:    HIV: NON-REACTIVE (11/23 0914)  GBS:    2 hr Glucola 122 Genetic screening  declined Anatomy US WNL  Prenatal Transfer Tool  Maternal Diabetes: No Genetic Screening: Declined Maternal Ultrasounds/Referrals: Normal Fetal Ultrasounds or other Referrals:  Referred to Materal Fetal Medicine  Maternal Substance Abuse:  No Significant Maternal Medications:  Meds include: Other: Lovenox, Actigall Significant Maternal Lab  Results: Group B Strep negative and Rh negative  No results found for this or any previous visit (from the past 24 hour(s)).  Patient Active Problem List   Diagnosis Date Noted   Encounter for induction of labor 02/14/2019   Cholestasis during pregnancy, antepartum 12/20/2018   Anemia affecting pregnancy in third trimester 12/16/2018   Rh negative state in antepartum period 12/15/2018   Supervision of high risk pregnancy, antepartum 10/10/2018   Postural orthostatic tachycardia syndrome 06/25/2018  History of DVT (deep vein thrombosis) 09/27/2016   Tachycardia 07/25/2015   ADD (attention deficit disorder) 02/11/2014    Assessment/Plan:  Sabrina Riddle is a 24 y.o. G1P0 at 37w0dhere for IOL d/t cholestasis  #Labor: Cytotec x1 placed vaginally. Patient willing to accept FB, would prefer not to use pitocin, unless deemed necessary to move labor forward. Admit labs pending.  #Cholestasis- Bile acids 13 on 12/15/18, then 15 on 01/13/19. On actigall. Received BMZ x2 1/21, 1/22 with planned delivery for 36w. She is currently endorsing mild pruritis.  #Rh neg- Received rhogam on 12/15/2018. Evaluate for rhogam prior to discharge.  #H/O DVT- Was on lovenox 467mdaily during pregnancy, last dose 02/12/2019. Plan to restart in postpartum period. SCDs ordered. Patient had DVT in [place] subclavian vein while on COCs in 2015.  #Pain: Per patient request #FWB:  Cat 1; EFW: 2600g #ID:  GBS neg #MOF: breast #MOC: declines #Circ:  n/a #Vit K- Of note, patient does not want any vaccines to be given to her infant. She is planning to administer oral vitamin K herself and follow up with her pediatrician, [facility], who she states is aware of this plan and agreed to it ahead of time. Discussed risks and benefits of Vit K injection, including death by hemorrhage if not given, that there is no oral solution approved for use in the [place] and that oral regimens do not consistently achieve adequate  prophylaxis.  CaGladys DammeMD [id number] 02/14/2019, 1:48 AM    I confirm that I have verified the information documented in the resident's note and that I have also personally  reviewed and was present for  the history, physical exam and all medical decision making activities of this service and have verified that all service and findings are accurately documented in this student's note.   -Discussed r/b of induction including fetal distress, serial induction, pain, and increased risk of c/s delivery -Discussed induction methods including cervical ripening agents, foley bulbs, and pitocin -Patient verbalizes understanding and wishes to proceed with induction process  -Encouraged rest and patient agreeable to Ambien 35m72m EmlGavin PoundNM 02/14/2019 3:11 AM

## 2019-02-14 NOTE — Progress Notes (Signed)
Labor Progress Note Sabrina Riddle is a 24 y.o. G1P0 at [redacted]w[redacted]d presented for IOL for cholestasis  S:  Patient comfortable with epidural  O:  BP (!) 100/52   Pulse (!) 59   Temp 98.4 F (36.9 C) (Oral)   Resp 16   Ht 5\' 4"  (1.626 m)   Wt 80.5 kg   LMP 06/07/2018   BMI 30.47 kg/m   Fetal Tracing:  Baseline: 130 Variability: moderate Accels: 15x15 Decels: none  Toco: 2-4   CVE: Dilation: 4 Effacement (%): 50 Station: -2 Presentation: Vertex Exam by:: lee   A&P: 24 y.o. G1P0 [redacted]w[redacted]d IOL cholestasis  #Labor: Pit started at 27. Continue to titrate pitocin to achieve adequate contractions #Pain: epidural #FWB: Cat 1 #GBS negative  60, CNM 7:30 PM

## 2019-02-15 ENCOUNTER — Encounter (HOSPITAL_COMMUNITY): Payer: Self-pay | Admitting: Family Medicine

## 2019-02-15 ENCOUNTER — Inpatient Hospital Stay (HOSPITAL_COMMUNITY): Payer: 59

## 2019-02-15 DIAGNOSIS — O360930 Maternal care for other rhesus isoimmunization, third trimester, not applicable or unspecified: Secondary | ICD-10-CM

## 2019-02-15 DIAGNOSIS — Z3A36 36 weeks gestation of pregnancy: Secondary | ICD-10-CM

## 2019-02-15 DIAGNOSIS — O2662 Liver and biliary tract disorders in childbirth: Secondary | ICD-10-CM

## 2019-02-15 DIAGNOSIS — K831 Obstruction of bile duct: Secondary | ICD-10-CM

## 2019-02-15 LAB — HEPATITIS B SURFACE ANTIGEN: Hepatitis B Surface Ag: NONREACTIVE

## 2019-02-15 MED ORDER — METHYLERGONOVINE MALEATE 0.2 MG/ML IJ SOLN
INTRAMUSCULAR | Status: AC
  Filled 2019-02-15: qty 1

## 2019-02-15 MED ORDER — TETANUS-DIPHTH-ACELL PERTUSSIS 5-2.5-18.5 LF-MCG/0.5 IM SUSP: 0.5000 mL | Freq: Once | INTRAMUSCULAR | Status: DC

## 2019-02-15 MED ORDER — MISOPROSTOL 200 MCG PO TABS: 800.0000 ug | ORAL_TABLET | Freq: Once | ORAL | Status: AC

## 2019-02-15 MED ORDER — ONDANSETRON HCL 4 MG/2ML IJ SOLN: 4.0000 mg | INTRAMUSCULAR | Status: DC | PRN

## 2019-02-15 MED ORDER — SENNOSIDES-DOCUSATE SODIUM 8.6-50 MG PO TABS
2.0000 | ORAL_TABLET | ORAL | Status: DC
  Administered 2019-02-16 – 2019-02-17 (×2): 2 via ORAL
  Filled 2019-02-15 (×2): qty 2

## 2019-02-15 MED ORDER — ALUM & MAG HYDROXIDE-SIMETH 200-200-20 MG/5ML PO SUSP: 30.0000 mL | Freq: Once | ORAL | Status: DC

## 2019-02-15 MED ORDER — DIPHENHYDRAMINE HCL 25 MG PO CAPS: 25.0000 mg | ORAL_CAPSULE | Freq: Four times a day (QID) | ORAL | Status: DC | PRN

## 2019-02-15 MED ORDER — LIDOCAINE VISCOUS HCL 2 % MT SOLN
15.0000 mL | Freq: Once | OROMUCOSAL | Status: DC
  Filled 2019-02-15: qty 15

## 2019-02-15 MED ORDER — TRANEXAMIC ACID-NACL 1000-0.7 MG/100ML-% IV SOLN
INTRAVENOUS | Status: AC
  Administered 2019-02-15: 1000 mg via INTRAVENOUS
  Filled 2019-02-15: qty 100

## 2019-02-15 MED ORDER — TRANEXAMIC ACID-NACL 1000-0.7 MG/100ML-% IV SOLN: 1000.0000 mg | INTRAVENOUS | Status: AC

## 2019-02-15 MED ORDER — METHYLERGONOVINE MALEATE 0.2 MG/ML IJ SOLN
0.2000 mg | Freq: Once | INTRAMUSCULAR | Status: AC
  Administered 2019-02-15: 0.2 mg via INTRAMUSCULAR

## 2019-02-15 MED ORDER — COCONUT OIL OIL
1.0000 "application " | TOPICAL_OIL | Status: DC | PRN
  Administered 2019-02-15: 1 via TOPICAL

## 2019-02-15 MED ORDER — BENZOCAINE-MENTHOL 20-0.5 % EX AERO
1.0000 "application " | INHALATION_SPRAY | CUTANEOUS | Status: DC | PRN
  Administered 2019-02-15: 1 via TOPICAL
  Filled 2019-02-15: qty 56

## 2019-02-15 MED ORDER — ONDANSETRON HCL 4 MG PO TABS: 4.0000 mg | ORAL_TABLET | ORAL | Status: DC | PRN

## 2019-02-15 MED ORDER — PANTOPRAZOLE SODIUM 40 MG IV SOLR
40.0000 mg | Freq: Once | INTRAVENOUS | Status: DC
  Filled 2019-02-15: qty 40

## 2019-02-15 MED ORDER — IBUPROFEN 600 MG PO TABS
600.0000 mg | ORAL_TABLET | Freq: Four times a day (QID) | ORAL | Status: DC
  Administered 2019-02-15 – 2019-02-17 (×8): 600 mg via ORAL
  Filled 2019-02-15 (×8): qty 1

## 2019-02-15 MED ORDER — MISOPROSTOL 200 MCG PO TABS
ORAL_TABLET | ORAL | Status: AC
  Administered 2019-02-15: 800 ug via RECTAL
  Filled 2019-02-15: qty 4

## 2019-02-15 MED ORDER — PRENATAL MULTIVITAMIN CH
1.0000 | ORAL_TABLET | Freq: Every day | ORAL | Status: DC
  Filled 2019-02-15: qty 1

## 2019-02-15 MED ORDER — WITCH HAZEL-GLYCERIN EX PADS: 1.0000 "application " | MEDICATED_PAD | CUTANEOUS | Status: DC | PRN

## 2019-02-15 MED ORDER — ACETAMINOPHEN 325 MG PO TABS
650.0000 mg | ORAL_TABLET | ORAL | Status: DC | PRN
  Filled 2019-02-15: qty 2

## 2019-02-15 MED ORDER — SIMETHICONE 80 MG PO CHEW: 80.0000 mg | CHEWABLE_TABLET | ORAL | Status: DC | PRN

## 2019-02-15 MED ORDER — DIBUCAINE (PERIANAL) 1 % EX OINT: 1.0000 "application " | TOPICAL_OINTMENT | CUTANEOUS | Status: DC | PRN

## 2019-02-15 NOTE — Progress Notes (Signed)
Labor Progress Note Sabrina Riddle is a 23 y.o. G1P0 at [redacted]w[redacted]d presented for IOL d/t cholestasis S: Patient sitting comfortably in bed, epidural in place  O:  BP (!) 107/58   Pulse (!) 50   Temp 98.1 F (36.7 C) (Oral)   Resp 15   Ht 5\' 4"  (1.626 m)   Wt 80.5 kg   LMP 06/07/2018   BMI 30.47 kg/m  EFM: 120 bpm/+accels/-decels  CVE: Dilation: 5.5 Effacement (%): 60 Station: -2 Presentation: Vertex Exam by:: Citlalic Norlander   A&P: 24 y.o. G1P0 [redacted]w[redacted]d here for IOL d/t cholestasis #Labor: Pitocin at 61mu/min. AROM for clear at 0210, plan to recheck in 3 hours. #Pain: Epidural in place #FWB: Cat I #GBS negative  16m, MD 2:15 AM

## 2019-02-15 NOTE — Progress Notes (Signed)
Sabrina Riddle is a 24 y.o. G1P0 at [redacted]w[redacted]d admitted for IOL 2/2 cholestasis  Subjective: Feeling a lot of pressure in her bottom.  Objective: BP 120/79   Pulse 79   Temp 98.4 F (36.9 C) (Oral)   Resp 17   Ht 5\' 4"  (1.626 m)   Wt 80.5 kg   LMP 06/07/2018   BMI 30.47 kg/m  Total I/O In: -  Out: 625 [Urine:625]  FHT:  FHR: 135 bpm, variability: moderate,  accelerations:  Present,  decelerations:  Absent UC:   regular, every 2-3 minutes  SVE:   Dilation: 10 Effacement (%): 80 Station: Plus 2 Exam by:: Dr. [email or web address]  Pitocin @ 24 mu/min  Labs: Lab Results  Component Value Date   WBC 10.8 (H) 02/14/2019   HGB 10.6 (L) 02/14/2019   HCT 31.7 (L) 02/14/2019   MCV 92.4 02/14/2019   PLT 280 02/14/2019    Assessment / Plan: 24 y.o. G1P0 [redacted]w[redacted]d here for IOL d/t cholestasis  #Labor: Progressing well. Pitocin at 24 mu/min. S/p AROM. Will start pushing.  #Pain: Epidural in place #FWB: Cat I #GBSL negative  Anticipate vaginal delivery.  [redacted]w[redacted]d DO OB Fellow, [facility] 02/15/2019, 11:04 AM

## 2019-02-15 NOTE — Progress Notes (Signed)
Labor Progress Note Arlo Buffone is a 24 y.o. G1P0 at [redacted]w[redacted]d presented for IOL d/t cholestasis S: Resting comfortable with peanut ball. Did feel pressure earlier.  O:  BP 137/73   Pulse (!) 59   Temp 98.5 F (36.9 C) (Oral)   Resp 16   Ht 5\' 4"  (1.626 m)   Wt 80.5 kg   LMP 06/07/2018   BMI 30.47 kg/m  EFM: 130, moderate variability, pos accels, no decels, reactive TOCO: q2-44m  CVE: Dilation: 6 Effacement (%): 80 Station: -1 Presentation: Vertex Exam by:: Tonjia Parillo   A&P: 24 y.o. G1P0 [redacted]w[redacted]d here for IOL d/t cholestasis #Labor: Progressing well. Pitocin at 22 mu/min. S/p AROM. No IUPC thus far as making cervical change. Anticipate SVD. #Pain: Epidural in place #FWB: Cat I #GBS negative  [redacted]w[redacted]d, MD 6:03 AM

## 2019-02-15 NOTE — Lactation Note (Signed)
This note was copied from a baby's chart. Lactation Consultation Note  Patient Name: Sabrina Riddle [id number] Date: 02/15/2019 Reason for consult: Initial assessment;Late-preterm 34-36.6wks;Primapara;1st time breastfeeding;Infant < 6lbs  P1 mother whose infant is now 60 hours old.  This is a LPTI at 36+1 weeks weighing < 6 lbs.    RN requested LC assistance.  Baby had attempted to breast feed but was sleepy.  When I arrived she was asleep in her bassinet.    Reviewed the LPTI policy in depth with mother.  Discussed the options of using donor milk instead of formula for supplementation if mother unable to produce enough EBM at this time.  Mother would prefer donor milk as needed.  Mother's breasts are soft and non tender and nipples are everted and intact.  Encouraged using EBM and coconut oil for comfort.  RN provided the coconut oil and mother will use as needed.  Initiated the DEBP.  Pump parts, assembly, disassembly and cleaning discussed.  Observed mother pumping for 15 minutes while discussing breast feeding basics including STS, hand expression, latching, tummy size and feeding cues.  Mother stated that her baby has shown feeding cues.  #24 flange size is appropriate for mother at this time.  At the end of her pumping session mother was able to produce 7 mls of EBM.  She was very excited to see this and informed me that "no one in her family has been successful with breast feeding."  I shared in her excitement and praised her efforts.  Demonstrated the curved tip syringe and baby consumed the 7 mls without difficulty.  Showed mother how to effectively burp and baby burped well.  She was very content after her supplementation.    Mother will feed at least every three hours or sooner if baby shows cues.  Reviewed feeding cues.  She is able to hand express colostrum and container provided for any EBM she obtains with hand expression.  Presented the option of using the curved tip syringe or the  slow flow gold nipple.  Encouraged the nipple after baby is able to consume higher volumes.    Mom made aware of O/P services, breastfeeding support groups, community resources, and our phone # for post-discharge questions. Father present.  Mother has a DEBP for home use.  Suggested mother call for latch assistance when baby is ready to feed again.  RN in room and aware.   Maternal Data Formula Feeding for Exclusion: No Has patient been taught Hand Expression?: Yes Does the patient have breastfeeding experience prior to this delivery?: No  Feeding Feeding Type: Breast Fed  LATCH Score Latch: Too sleepy or reluctant, no latch achieved, no sucking elicited.  Audible Swallowing: None  Type of Nipple: Everted at rest and after stimulation  Comfort (Breast/Nipple): Soft / non-tender  Hold (Positioning): Full assist, staff holds infant at breast  LATCH Score: 4  Interventions    Lactation Tools Discussed/Used Tools: Pump;Coconut oil Breast pump type: Double-Electric Breast Pump Pump Review: Setup, frequency, and cleaning;Milk Storage Initiated by:: Laureen Ochs Date initiated:: 02/15/19   Consult Status Consult Status: Follow-up Date: 02/16/19 Follow-up type: In-patient    Dora Sims 02/15/2019, 6:22 PM

## 2019-02-16 ENCOUNTER — Encounter: Payer: 59 | Admitting: Obstetrics & Gynecology

## 2019-02-16 LAB — CBC
HCT: 29.1 % — ABNORMAL LOW (ref 36.0–46.0)
Hemoglobin: 9.7 g/dL — ABNORMAL LOW (ref 12.0–15.0)
MCH: 31 pg (ref 26.0–34.0)
MCHC: 33.3 g/dL (ref 30.0–36.0)
MCV: 93 fL (ref 80.0–100.0)
Platelets: 238 10*3/uL (ref 150–400)
RBC: 3.13 MIL/uL — ABNORMAL LOW (ref 3.87–5.11)
RDW: 13.2 % (ref 11.5–15.5)
WBC: 13.2 10*3/uL — ABNORMAL HIGH (ref 4.0–10.5)
nRBC: 0 % (ref 0.0–0.2)

## 2019-02-16 MED ORDER — ENOXAPARIN SODIUM 40 MG/0.4ML ~~LOC~~ SOLN
40.0000 mg | SUBCUTANEOUS | Status: DC
  Administered 2019-02-17: 40 mg via SUBCUTANEOUS
  Filled 2019-02-16 (×2): qty 0.4

## 2019-02-16 MED ORDER — RHO D IMMUNE GLOBULIN 1500 UNIT/2ML IJ SOSY
300.0000 ug | PREFILLED_SYRINGE | Freq: Once | INTRAMUSCULAR | Status: AC
  Administered 2019-02-16: 300 ug via INTRAVENOUS
  Filled 2019-02-16: qty 2

## 2019-02-16 NOTE — Progress Notes (Signed)
Post Partum Day 1 Subjective: no complaints, up ad lib, voiding, tolerating PO and + flatus  Objective: Blood pressure 119/83, pulse 75, temperature 98.5 F (36.9 C), temperature source Oral, resp. rate 20, height 5\' 4"  (1.626 m), weight 80.5 kg, last menstrual period 06/07/2018, SpO2 98 %, unknown if currently breastfeeding.  Physical Exam:  General: alert, cooperative and no distress Lochia: appropriate Uterine Fundus: firm Incision: n/a DVT Evaluation: No evidence of DVT seen on physical exam.  Recent Labs    02/14/19 0215  HGB 10.6*  HCT 31.7*    Assessment/Plan: Plan for discharge tomorrow, Breastfeeding, Lactation consult and Contraception discussed options, planning NFP at this time   LOS: 2 days   02/16/19 CNM 02/16/2019, 5:45 AM

## 2019-02-16 NOTE — Anesthesia Postprocedure Evaluation (Signed)
Anesthesia Post Note  Patient: Sabrina Riddle  Procedure(s) Performed: AN AD HOC LABOR EPIDURAL     Patient location during evaluation: Mother Baby Anesthesia Type: Epidural Level of consciousness: awake Pain management: satisfactory to patient Vital Signs Assessment: post-procedure vital signs reviewed and stable Respiratory status: spontaneous breathing Cardiovascular status: stable Anesthetic complications: no    Last Vitals:  Vitals:   02/16/19 0118 02/16/19 0525  BP: 114/68 119/83  Pulse: 75 75  Resp: 18 20  Temp: 37.1 C 36.9 C  SpO2:      Last Pain:  Vitals:   02/16/19 0536  TempSrc:   PainSc: 3    Pain Goal: Patients Stated Pain Goal: 0 (02/14/19 1421)                 Cephus Shelling

## 2019-02-16 NOTE — Plan of Care (Signed)
  Problem: Life Cycle: Goal: Chance of risk for complications during the postpartum period will decrease Note: Patient declined her lovenox stating that her hematologist instructed her to stop taking while she breast fed. Patient is wearing SCD's regularly. Discussed the importance of increasing ambulation. Earl Gala, Linda Hedges [place]

## 2019-02-16 NOTE — Lactation Note (Signed)
This note was copied from a baby's chart. Lactation Consultation Note  Patient Name: Sabrina Riddle [id number] Date: 02/16/2019   Parents/baby were recently seen by Anise Salvo, SLP. Per SLP, the Nfant Extra-Slow Flow nipple is appropriate at this time.  I observed Mom pumping. Size 24 flanges are an appropriate size for her at this time. I discussed using a small amount of coconut oil to pre-lubricate flanges prior to pumping. Mom was discouraged b/c she had pumped 8 mL yesterday & the most she had pumped today was 2 mL. With this last pumping session, Mom hardly obtained even 0.5 mL.   I assisted Mom with hand expression & she was able to return self-demonstration; however, Mom found her breasts to be tender. I reassured Mom that what we were seeing with her EBM output was to be expected for a primip.   Mom says her hematologist told her not to take Lovenox b/c it could get into breast milk. I informed Mom that my resources (from [employer] "Medications & Mother's Milk" 2019 & [email or web address] ) showed that the molecular size was too large to get into breast milk, and even if it did, it would be broken down in the baby's gut. Mom was interested in seeing that, so I printed off those resources for her to have a discussion with her hematologist.    Remigio Eisenmenger 02/16/2019, 1:20 PM

## 2019-02-17 ENCOUNTER — Ambulatory Visit: Payer: Self-pay

## 2019-02-17 LAB — RH IG WORKUP (INCLUDES ABO/RH)
ABO/RH(D): O NEG
Fetal Screen: NEGATIVE
Gestational Age(Wks): 36.1
Unit division: 0

## 2019-02-17 MED ORDER — ACETAMINOPHEN 325 MG PO TABS: 650.0000 mg | ORAL_TABLET | ORAL | 0 refills | Status: DC | PRN

## 2019-02-17 MED ORDER — IBUPROFEN 600 MG PO TABS: 600.0000 mg | ORAL_TABLET | Freq: Four times a day (QID) | ORAL | 0 refills | Status: DC

## 2019-02-17 MED ORDER — ENOXAPARIN SODIUM 40 MG/0.4ML ~~LOC~~ SOLN: 40.0000 mg | SUBCUTANEOUS | 0 refills | Status: DC

## 2019-02-17 NOTE — Lactation Note (Signed)
This note was copied from a baby's chart. Lactation Consultation Note  Patient Name: Sabrina Riddle [id number] Date: 02/17/2019  Mom requiring assistance with breastfeeding.  Reports she really hasnt breastfed since she has been here.  Assist mom in latching infant to right breast with 20 mm nipple shield.  However infant compressed  Moms nipple slightly with 20 mm nipple shield so 16 mm may be more be more appropriate based on moms nipple size and infants mouth. Used curved tip syringe to prefill shield.  Infant opened and latched and breastfed quite well. With rythmic sucking and audible swallows heard.  Showed parents how to keep her gpong with the breastmilk in the shield.   Did not want to flange lips.  Encouraged mom to keep working with her to get her mouth open wide and lips flanged at breast.  Infant took 20 ml of breastmilk via curved tip syringe in nipple shield at breast.  Urged mom to try and pump more often.   Mom has been pumping past every other feed approximately. Urged mom to massage and hand express prior to pumping.  Mom inquired about flange fit.  Reports she would like to try smaller flanges.  Gave mom 21 mm flanges.  Urged her to use whichever feels the best and gives her the best milk out take.  Mom has been using coconut oil lathered all over breasts and nipples and also inside flanges of the 24 shields.  Urged mom to call lactation for next feed to assist with nipple shield use and use with  5 french and syringe. Praised all of moms breastfeeding efforts.   Maternal Data    Feeding Feeding Type: Breast Milk Nipple Type: Nfant Extra Slow Flow (gold)  LATCH Score                   Interventions    Lactation Tools Discussed/Used     Consult Status      Chrissie Dacquisto Michaelle Copas 02/17/2019, 7:39 PM

## 2019-02-17 NOTE — Lactation Note (Signed)
This note was copied from a baby's chart. Lactation Consultation Note Mom called for assistance in using 5 Fr. Tubing and syring in NS. Mom and FOB attempted and spilt some Donor milk. Assisted in setting up. Taped tubing to breast, mom stated she forgot to do that, and inverted NS for suction in application, mom stated she didn't do that that's why it probably leaked, she just laid it on her nipple. Mom was also in cradle position. Suggested football so mom could be more in control of her breast and baby. Mom was comfortable in football position. Baby is very jaundice in color.  Baby actively suckled using #16NS.  Mom has already pre-loaded NS w/ Donor BM. Encouraged mom to occasionally massage breast and assess for transfer by assessing breast before and after feeding. A lot of teaching during the feeding done. Mom was feeding baby Donor milk every time she wanted to feed which was around 2 hrs. Encouraged mom to put baby on the breast when she wants but not to feed supplement but every 2 1/2 -3 hrs.  Encouraged to feed STS. Reviewed cleaning of tubing and syring. Reported to RN.  Patient Name: Sabrina Riddle [id number] Date: 02/17/2019 Reason for consult: Mother's request;Difficult latch;Primapara;Infant < 6lbs;Late-preterm 34-36.6wks   Maternal Data Has patient been taught Hand Expression?: Yes  Feeding Feeding Type: Donor Breast Milk  LATCH Score Latch: Grasps breast easily, tongue down, lips flanged, rhythmical sucking.     Type of Nipple: Everted at rest and after stimulation(semi flat very short shaft)  Comfort (Breast/Nipple): Filling, red/small blisters or bruises, mild/mod discomfort(breast filling/heavy)  Hold (Positioning): Assistance needed to correctly position infant at breast and maintain latch.     Interventions Interventions: Breast feeding basics reviewed;Support pillows;Assisted with latch;Position options;Skin to skin;Breast massage;Hand  express;Shells;Coconut oil;Pre-pump if needed;Breast compression;DEBP;Adjust position  Lactation Tools Discussed/Used Tools: 17F feeding tube / Syringe;Nipple Dorris Carnes;Shells;Pump;Flanges Nipple shield size: 16 Flange Size: 21 Shell Type: Inverted Breast pump type: Double-Electric Breast Pump   Consult Status Consult Status: Follow-up Date: 02/18/19 Follow-up type: In-patient    Charyl Dancer 02/17/2019, 10:27 PM

## 2019-02-17 NOTE — Progress Notes (Signed)
Pt declines MMR vaccine

## 2019-02-17 NOTE — Discharge Summary (Addendum)
Postpartum Discharge Summary  Patient Name: Sabrina Riddle DOB: 1995-09-17 MRN: [id number]  Date of admission: 02/14/2019 Delivering Provider: Leeroy Cha R   Date of discharge: 02/17/2019  Admitting diagnosis: Encounter for induction of labor [Z34.90] Intrauterine pregnancy: [redacted]w[redacted]d    Secondary diagnosis:  Active Problems:   Encounter for induction of labor  Additional problems: Cholestasis of pregnancy, h/o DVT, anemia, Rh negative     Discharge diagnosis: Preterm Pregnancy Delivered                                                                                                Post partum procedures:n/a  Augmentation: AROM, Pitocin, Cytotec and Foley Balloon  Complications: [id number] pre-delivery hgb 10.6, post-delivery hgb 9.6  Hospital course:  Induction of Labor With Vaginal Delivery   24y.o. yo G1P0101 at 337w1das admitted to the hospital 02/14/2019 for induction of labor.  Indication for induction: Cholestasis of pregnancy.  Patient had an uncomplicated labor course as follows: Membrane Rupture Time/Date: 2:11 AM ,02/15/2019   Intrapartum Procedures: Episiotomy: None [1]                                         Lacerations:  2nd degree [3]  Patient had delivery of a Viable infant.  Information for the patient's newborn:  BrApple, Dearmas0[[id number]]Delivery Method: Vaginal, Spontaneous(Filed from Delivery Summary)    02/15/2019  Details of delivery can be found in separate delivery note.  Patient had a routine postpartum course. Patient is discharged home 02/17/19. Delivery time: 11:17 AM    Magnesium Sulfate received: No BMZ received: Yes Rhophylac:Yes MMR:No Transfusion:No  Physical exam  Vitals:   02/16/19 0525 02/16/19 1424 02/16/19 2211 02/17/19 0549  BP: 119/83 114/77 109/62 116/74  Pulse: 75 82 80 63  Resp: '20 18 18   '$ Temp: 98.5 F (36.9 C) 98.3 F (36.8 C) 98.3 F (36.8 C) 98.1 F (36.7 C)  TempSrc: Oral Oral Oral   SpO2:  99%     Weight:      Height:       General: alert, cooperative and no distress Lochia: appropriate Uterine Fundus: firm Incision: N/A DVT Evaluation: No evidence of DVT seen on physical exam. No cords or calf tenderness. No significant calf/ankle edema  Labs: Lab Results  Component Value Date   WBC 13.2 (H) 02/16/2019   HGB 9.7 (L) 02/16/2019   HCT 29.1 (L) 02/16/2019   MCV 93.0 02/16/2019   PLT 238 02/16/2019   CMP Latest Ref Rng & Units 12/26/2018  Glucose 70 - 99 mg/dL 86  BUN 6 - 20 mg/dL 8  Creatinine 0.44 - 1.00 mg/dL 0.52  Sodium 135 - 145 mmol/L 138  Potassium 3.5 - 5.1 mmol/L 4.1  Chloride 98 - 111 mmol/L 108  CO2 22 - 32 mmol/L 22  Calcium 8.9 - 10.3 mg/dL 9.1  Total Protein 6.5 - 8.1 g/dL 6.0(L)  Total Bilirubin 0.3 - 1.2 mg/dL 0.5  Alkaline Phos 38 - 126  U/L 65  AST 15 - 41 U/L 14(L)  ALT 0 - 44 U/L 12    Discharge instruction: per After Visit Summary and "Baby and Me Booklet".  After visit meds:  Allergies as of 02/17/2019      Reactions   Methylphenidate Itching   Penicillins Anaphylaxis   Did it involve swelling of the face/tongue/throat, SOB, or low BP? Yes Did it involve sudden or severe rash/hives, skin peeling, or any reaction on the inside of your mouth or nose? Yes Did you need to seek medical attention at a hospital or doctor's office? Yes When did it last happen?In high school If all above answers are "NO", may proceed with cephalosporin use.   Morphine Nausea And Vomiting   Concerta [methylphenidate Hcl Er (cd)] Itching   Dexmethylphenidate Itching   Hydrocodone Nausea And Vomiting   Vyvanse [lisdexamfetamine Dimesylate] Palpitations      Medication List    STOP taking these medications   Blood Pressure Kit Devi   Breast Pump Misc   magnesium oxide 400 MG tablet Commonly known as: MAG-OX   ursodiol 500 MG tablet Commonly known as: ACTIGALL     TAKE these medications   acetaminophen 325 MG tablet Commonly known as:  Tylenol Take 2 tablets (650 mg total) by mouth every 4 (four) hours as needed (for pain scale < 4).   alum & mag hydroxide-simeth 200-200-20 MG/5ML suspension Commonly known as: MAALOX/MYLANTA Take 30 mLs by mouth every 6 (six) hours as needed for indigestion or heartburn.   enoxaparin 40 MG/0.4ML injection Commonly known as: LOVENOX Inject 40 mg into the skin daily.   ibuprofen 600 MG tablet Commonly known as: ADVIL Take 1 tablet (600 mg total) by mouth every 6 (six) hours.   prenatal multivitamin Tabs tablet Take 1 tablet by mouth at bedtime.       Diet: routine diet  Activity: Advance as tolerated. Pelvic rest for 6 weeks.   Outpatient follow up:4 weeks Follow up Appt:No future appointments. Follow up Visit:  Please schedule this patient for Postpartum visit in: 4 weeks with the following provider: Any provider In-Person For C/S patients schedule nurse incision check in weeks 2 weeks: no High risk pregnancy complicated by: Cholestasis of pregnancy, H/O DVT on lovenox Delivery mode:  SVD Anticipated Birth Control:  Condoms PP Procedures needed: check for lovenox continuation  Schedule Integrated BH visit: no  Newborn Data: Live born female  Birth Weight: 5 lb 7.8 oz (2490 g) APGAR: 8, 9  Newborn Delivery   Birth date/time: 02/15/2019 11:17:00 Delivery type: Vaginal, Spontaneous      Baby Feeding: Breast Disposition:rooming in   02/17/2019 Gladys Damme, MD  Midwife attestation I have seen and examined this patient and agree with above documentation in the resident's note.   Sabrina Riddle is a 24 y.o. G1P0101 s/p SVD.  Pain is well controlled. Plan for birth control is condoms. Method of Feeding: breast  PE:  Gen: well appearing Heart: reg rate Lungs: normal WOB Fundus firm Ext: no pain, no edema  Recent Labs    02/16/19 0718  HGB 9.7*  HCT 29.1*    Assessment S/p SVD PPD # 2  Plan: - discharge home - postpartum care discussed -  f/u in office in 6 weeks for postpartum visit   Julianne Handler, CNM 9:31 AM

## 2019-02-17 NOTE — Progress Notes (Signed)
AVS printed and discharged instructions given to patient. Patient aware of follow up appointment and to pick up prescriptions. Patient verbalized understanding and has no further questions.

## 2019-02-17 NOTE — Lactation Note (Signed)
This note was copied from a baby's chart. Lactation Consultation Note  Patient Name: Sabrina Riddle Date: 02/17/2019 Reason for consult: Follow-up assessment   LC Follow Up Visit:  Attempted to visit with mother, however, there was a "Do Not Disturb" sign on her door; will return later.   Consult Status Consult Status: Follow-up Date: 02/17/19 Follow-up type: In-patient    Layaan Mott R Murna Backer 02/17/2019, 8:30 AM

## 2019-02-18 ENCOUNTER — Encounter (HOSPITAL_COMMUNITY): Payer: Self-pay

## 2019-02-18 ENCOUNTER — Ambulatory Visit (HOSPITAL_COMMUNITY): Payer: 59

## 2019-02-18 ENCOUNTER — Ambulatory Visit: Payer: Self-pay

## 2019-02-18 LAB — TYPE AND SCREEN
ABO/RH(D): O NEG
Antibody Screen: POSITIVE
Unit division: 0
Unit division: 0

## 2019-02-18 LAB — BPAM RBC
Blood Product Expiration Date: 202102242359
Blood Product Expiration Date: 202102252359
Unit Type and Rh: 9500
Unit Type and Rh: 9500

## 2019-02-18 NOTE — Lactation Note (Addendum)
This note was copied from a baby's chart. Lactation Consultation Note Baby 25 hrs old. Mom called to show LC her milk has came in. Mom pumped 30 ml transitional milk. Mom stated her breast were hurting but she pumped and now it is soft again. They were hard. Baby on DPT. Mom stated that she hasn't put the baby to the breast today d/t being on lights. Mom stated she didn't want to stress the baby out so she fed the baby w/bottle. When baby comes off of the lights mom stated she will try to BF again using NS. Mom is excited that her milk is in and she has enough to feed the baby w/her milk. Praised mom. Encouraged mom to call for assistance or questions.  Mom stated she has come up with a plan and its working for right now.  Patient Name: Girl Sabrina Riddle'X Date: 02/18/2019 Reason for consult: Follow-up assessment;Primapara;Hyperbilirubinemia;Late-preterm 34-36.6wks   Maternal Data    Feeding Feeding Type: Donor Breast Milk Nipple Type: Regular  LATCH Score          Comfort (Breast/Nipple): Engorged, cracked, bleeding, large blisters, severe discomfort(milk came in)  Hold (Positioning): No assistance needed to correctly position infant at breast.     Interventions Interventions: DEBP;Shells;Coconut oil;Expressed milk;Breast massage;Breast compression  Lactation Tools Discussed/Used Breast pump type: Double-Electric Breast Pump   Consult Status Consult Status: Follow-up Date: 02/19/19 Follow-up type: In-patient    Charyl Dancer 02/18/2019, 11:34 PM

## 2019-02-18 NOTE — Lactation Note (Signed)
This note was copied from a baby's chart. Lactation Consultation Note LC checked on mom to see how feedings are going. Everyone in room sleeping.  Patient Name: Sabrina Riddle JHERD'E Date: 02/18/2019     Maternal Data    Feeding Feeding Type: Bottle Fed - Breast Milk Nipple Type: Regular  LATCH Score                   Interventions    Lactation Tools Discussed/Used     Consult Status      Charyl Dancer 02/18/2019, 10:39 PM

## 2019-02-18 NOTE — Lactation Note (Signed)
This note was copied from a baby's chart. Lactation Consultation Note  Patient Name: Sabrina Riddle [id number] Date: 02/18/2019 Reason for consult: Follow-up assessment;Late-preterm 34-36.6wks Baby is 72 hours old/6% weight loss.  Baby was started on phototherapy this morning.  Mom is pumping every 3 hours and supplementing with expressed milk and donor milk.  She last obtained 9 mls.  Due to lack of supply of donor milk from milk bank mom is using a family members frozen milk.  Mom called out for assist with bottle feeding.  She has been trying to bottle feed for past 25 minutes and baby showing no interest.  Baby is using the gold nipple provided by speech.  I unwrapped baby and baby slowly took 5 mls.  Nipple changed to purple nipple provided from speech which is slightly faster.  Baby took 10 mls in about 10 minutes.  Baby could possibly handle a faster flow nipple.  Instructed to attempt to feed baby 30+ mls every 3 hours.  Mom's breasts are comfortable.  Questions answered.  Encouraged to call for assist prn.  Maternal Data    Feeding Feeding Type: Donor Breast Milk Nipple Type: Nfant Slow Flow (purple)  LATCH Score                   Interventions    Lactation Tools Discussed/Used     Consult Status Consult Status: Follow-up Date: 02/19/19    [name] 02/18/2019, 11:22 AM

## 2019-02-19 ENCOUNTER — Ambulatory Visit: Payer: Self-pay

## 2019-02-19 NOTE — Lactation Note (Signed)
This note was copied from a baby's chart. Lactation Consultation Note  Patient Name: Sabrina Riddle Date: 02/19/2019 Reason for consult: Follow-up assessment Mom called with c/o discomfort in breasts even after pumping.  Both breasts warm and slightly engorged.  A few small firm milk ducts palpated.  Explained to patient that with milk coming in breasts have increased circulation/swelling.  Reassured.  Ice packs given to apply to both breasts for 30 minutes every 2 hours.  Instructed to use heat on firm areas 15 minutes prior to pumping along with good massage.  Maternal Data    Feeding Feeding Type: Donor Breast Milk Nipple Type: Nfant Extra Slow Flow (gold)(started with purple changed to gold fed by speech tx)  LATCH Score Latch: Grasps breast easily, tongue down, lips flanged, rhythmical sucking.(with nipple shield)  Audible Swallowing: Spontaneous and intermittent  Type of Nipple: Everted at rest and after stimulation  Comfort (Breast/Nipple): Soft / non-tender  Hold (Positioning): Assistance needed to correctly position infant at breast and maintain latch.  LATCH Score: 9  Interventions Interventions: Adjust position;Assisted with latch;Support pillows;Skin to skin;Breast massage  Lactation Tools Discussed/Used Tools: Nipple Dorris Carnes;Shells Nipple shield size: 20 Shell Type: Inverted   Consult Status Consult Status: Complete Follow-up type: Call as needed    [name] 02/19/2019, 2:01 PM

## 2019-02-19 NOTE — Lactation Note (Signed)
This note was copied from a baby's chart. Lactation Consultation Note  Patient Name: Sabrina Riddle [id number] Date: 02/19/2019 Reason for consult: Follow-up assessment;Late-preterm 34-36.6wks;Infant < 6lbs;Primapara Baby is 95 hours old/7% weight loss.  Phototherapy was discontinued this morning.  Discharge pending later today if rebound bili level WNL.  Mom states baby is feeding with bottle about every 2 hours and taking 25-51mls.  She states feeding is taking 15 minutes  Instructed to give 30+ mls today.  Speech will come and evaluate next bottle feeding.  Milk came to volume during the night.  Breasts are full but not engorged.  Mom continues to post pump and obtains 30-60 mls.  Mom has recently put baby back to breast.  She currently has baby in football hold on the right side.  16 mm nipple shield is applied.  Shield appears too small for nipple.  Recommended we try a 20 mm shield.  20 mm shield applied and fit looks appropriate.  Baby latched easily and fed with swallows for 5 minutes then fell asleep.  Milk in shield after feeding.  Mom instructed on good breast massage and compression during feeding.  Recommended using some warm washcloths to breast before feeding.  Discussed signs of engorgement and treatment.  Mom has a pump at home.  Reviewed plan for home as follows: feed baby with cues but at least 8 times/24 hours, post pump x 15-20 minutes and supplement with expressed milk as appropriate per day of life.  Strongly recommended an outpatient appointment mid week next week.  Reviewed phone number to schedule.  Questions answered.  Maternal Data    Feeding Feeding Type: Breast Fed  LATCH Score Latch: Grasps breast easily, tongue down, lips flanged, rhythmical sucking.(with nipple shield)  Audible Swallowing: Spontaneous and intermittent  Type of Nipple: Everted at rest and after stimulation  Comfort (Breast/Nipple): Soft / non-tender  Hold (Positioning): Assistance needed to  correctly position infant at breast and maintain latch.  LATCH Score: 9  Interventions Interventions: Adjust position;Assisted with latch;Support pillows;Skin to skin;Breast massage  Lactation Tools Discussed/Used Tools: Nipple Dorris Carnes;Shells Nipple shield size: 20 Shell Type: Inverted   Consult Status Consult Status: Complete Follow-up type: Call as needed    [name] 02/19/2019, 10:44 AM

## 2019-02-21 ENCOUNTER — Inpatient Hospital Stay (HOSPITAL_COMMUNITY)
Admission: AD | Admit: 2019-02-21 | Discharge: 2019-02-21 | Disposition: A | Discharge status: Home / Self Care | Payer: 59 | Source: Ambulatory Visit | Attending: Obstetrics & Gynecology | Admitting: Obstetrics & Gynecology

## 2019-02-21 ENCOUNTER — Encounter (HOSPITAL_COMMUNITY): Payer: Self-pay | Admitting: Obstetrics & Gynecology

## 2019-02-21 ENCOUNTER — Other Ambulatory Visit: Payer: Self-pay

## 2019-02-21 DIAGNOSIS — O9089 Other complications of the puerperium, not elsewhere classified: Secondary | ICD-10-CM | POA: Diagnosis not present

## 2019-02-21 DIAGNOSIS — O99893 Other specified diseases and conditions complicating puerperium: Secondary | ICD-10-CM | POA: Diagnosis present

## 2019-02-21 DIAGNOSIS — N898 Other specified noninflammatory disorders of vagina: Secondary | ICD-10-CM

## 2019-02-21 DIAGNOSIS — Z711 Person with feared health complaint in whom no diagnosis is made: Secondary | ICD-10-CM

## 2019-02-21 DIAGNOSIS — R5383 Other fatigue: Secondary | ICD-10-CM | POA: Insufficient documentation

## 2019-02-21 LAB — COMPREHENSIVE METABOLIC PANEL
ALT: 21 U/L (ref 0–44)
AST: 21 U/L (ref 15–41)
Albumin: 3.3 g/dL — ABNORMAL LOW (ref 3.5–5.0)
Alkaline Phosphatase: 97 U/L (ref 38–126)
Anion gap: 9 (ref 5–15)
BUN: 7 mg/dL (ref 6–20)
CO2: 24 mmol/L (ref 22–32)
Calcium: 8.9 mg/dL (ref 8.9–10.3)
Chloride: 107 mmol/L (ref 98–111)
Creatinine, Ser: 0.62 mg/dL (ref 0.44–1.00)
GFR calc Af Amer: >60 mL/min (ref >60)
GFR calc non Af Amer: >60 mL/min (ref >60)
Glucose, Bld: 101 mg/dL — ABNORMAL HIGH (ref 70–99)
Potassium: 4 mmol/L (ref 3.5–5.1)
Sodium: 140 mmol/L (ref 135–145)
Total Bilirubin: 0.3 mg/dL (ref 0.3–1.2)
Total Protein: 6.6 g/dL (ref 6.5–8.1)

## 2019-02-21 LAB — CBC WITH DIFFERENTIAL/PLATELET
Abs Immature Granulocytes: 0.03 10*3/uL (ref 0.00–0.07)
Basophils Absolute: 0 10*3/uL (ref 0.0–0.1)
Basophils Relative: 0 %
Eosinophils Absolute: 0.1 10*3/uL (ref 0.0–0.5)
Eosinophils Relative: 1 %
HCT: 33.3 % — ABNORMAL LOW (ref 36.0–46.0)
Hemoglobin: 10.9 g/dL — ABNORMAL LOW (ref 12.0–15.0)
Immature Granulocytes: 0 %
Lymphocytes Relative: 28 %
Lymphs Abs: 1.9 10*3/uL (ref 0.7–4.0)
MCH: 30.5 pg (ref 26.0–34.0)
MCHC: 32.7 g/dL (ref 30.0–36.0)
MCV: 93.3 fL (ref 80.0–100.0)
Monocytes Absolute: 0.4 10*3/uL (ref 0.1–1.0)
Monocytes Relative: 6 %
Neutro Abs: 4.5 10*3/uL (ref 1.7–7.7)
Neutrophils Relative %: 65 %
Platelets: 480 10*3/uL — ABNORMAL HIGH (ref 150–400)
RBC: 3.57 MIL/uL — ABNORMAL LOW (ref 3.87–5.11)
RDW: 12.8 % (ref 11.5–15.5)
WBC: 6.9 10*3/uL (ref 4.0–10.5)
nRBC: 0 % (ref 0.0–0.2)

## 2019-02-21 LAB — URINALYSIS, ROUTINE W REFLEX MICROSCOPIC
Bilirubin Urine: NEGATIVE
Glucose, UA: NEGATIVE mg/dL
Ketones, ur: NEGATIVE mg/dL
Nitrite: NEGATIVE
Protein, ur: NEGATIVE mg/dL
Specific Gravity, Urine: 1.002 — ABNORMAL LOW (ref 1.005–1.030)
pH: 6 (ref 5.0–8.0)

## 2019-02-21 NOTE — MAU Provider Note (Addendum)
History     CSN: [id number]  Arrival date and time: 02/21/19 1624   First Provider Initiated Contact with Patient 02/21/19 1723      Chief Complaint  Patient presents with  . Vaginal odor  . Fatigue   Sabrina Riddle is a 24 y.o. G1P1 who is s/p vaginal delivery on 1/24. She presents to MAU with complaints of vaginal odor and fatigue. She reports noticing a "bad smell" over the past 2 days, she reports smell has worsened and today "smells like bad trash". She denies increase in vaginal bleeding or abdominal pain/cramping. She reports having a 2nd degree laceration and PPH , since that vaginal bleeding is small like spotting. She describes the vaginal bleeding as dark red bleeding. She reports feeling extremely tired over the last 2 days, "thought it may have been because I am a new mom". Denies having a fever at home, reports low grade fever upon arrival to MAU. She denies HA, sore throat, urinary symptoms, any sick contacts. Denies IC. She reports she is breast feeding without any problems or concerns.   OB History    Gravida  1   Para  1   Term      Preterm  1   AB      Living  1     SAB      TAB      Ectopic      Multiple  0   Live Births  1           Past Medical History:  Diagnosis Date  . ADD (attention deficit disorder)   . Arcuate uterus   . Bicuspid aortic valve   . Cholestasis of pregnancy   . History of DVT (deep vein thrombosis)   . POTS (postural orthostatic tachycardia syndrome)   . Pulmonary embolism (HCC)   . Vitamin B12 deficiency     Past Surgical History:  Procedure Laterality Date  . TYMPANOSTOMY    . WISDOM TOOTH EXTRACTION    . WRIST SURGERY      Family History  Problem Relation Age of Onset  . Hypertension Mother   . Polycystic ovary syndrome Mother   . Kidney cancer Mother   . Diabetes Father   . Testicular cancer Father   . Polycystic ovary syndrome Sister   . Aneurysm Maternal Grandmother   . Aneurysm Paternal  Grandmother     Social History   Tobacco Use  . Smoking status: Never Smoker  . Smokeless tobacco: Never Used  Substance Use Topics  . Alcohol use: No  . Drug use: No    Allergies:  Allergies  Allergen Reactions  . Methylphenidate Itching  . Penicillins Anaphylaxis    Did it involve swelling of the face/tongue/throat, SOB, or low BP? Yes Did it involve sudden or severe rash/hives, skin peeling, or any reaction on the inside of your mouth or nose? Yes Did you need to seek medical attention at a hospital or doctor's office? Yes When did it last happen?In high school If all above answers are "NO", may proceed with cephalosporin use.   Marland Kitchen Morphine Nausea And Vomiting  . Concerta [Methylphenidate Hcl Er (Cd)] Itching  . Dexmethylphenidate Itching  . Hydrocodone Nausea And Vomiting  . Vyvanse [Lisdexamfetamine Dimesylate] Palpitations    Medications Prior to Admission  Medication Sig Dispense Refill Last Dose  . acetaminophen (TYLENOL) 325 MG tablet Take 2 tablets (650 mg total) by mouth every 4 (four) hours as needed (for pain scale <  4). 30 tablet 0 02/20/2019 at Unknown time  . alum & mag hydroxide-simeth (MAALOX/MYLANTA) 200-200-20 MG/5ML suspension Take 30 mLs by mouth every 6 (six) hours as needed for indigestion or heartburn.   Past Week at Unknown time  . enoxaparin (LOVENOX) 40 MG/0.4ML injection Inject 0.4 mLs (40 mg total) into the skin daily. 18 mL 0 02/20/2019 at Unknown time  . magnesium 30 MG tablet Take 30 mg by mouth 2 (two) times daily.   02/20/2019 at Unknown time  . Prenatal Vit-Fe Fumarate-FA (PRENATAL MULTIVITAMIN) TABS tablet Take 1 tablet by mouth at bedtime.   02/20/2019 at Unknown time  . ibuprofen (ADVIL) 600 MG tablet Take 1 tablet (600 mg total) by mouth every 6 (six) hours. 30 tablet 0  at Not taking    Review of Systems  Constitutional: Positive for fatigue. Negative for chills and fever.  Respiratory: Negative.   Cardiovascular: Negative.    Gastrointestinal: Negative.   Genitourinary: Positive for vaginal bleeding. Negative for difficulty urinating, dysuria, frequency, pelvic pain and urgency.       Vaginal odor  Musculoskeletal: Negative.   Neurological: Negative.    Physical Exam   Blood pressure 123/73, pulse (!) 103, temperature 99.2 F (37.3 C), temperature source Oral, resp. rate 16, last menstrual period 06/07/2018, SpO2 98 %, unknown if currently breastfeeding.  Physical Exam  Nursing note and vitals reviewed. Constitutional: She is oriented to person, place, and time. She appears well-developed and well-nourished. No distress.  Cardiovascular: Normal rate and regular rhythm.  Respiratory: Effort normal and breath sounds normal. No respiratory distress. She has no wheezes.  GI: Soft. She exhibits no distension. There is no abdominal tenderness. There is no rebound and no guarding.  Genitourinary:    Vaginal discharge and bleeding present.  There is bleeding in the vagina.    Genitourinary Comments: Pelvic exam: Cervix pink, visually open- golf ball sized blood clot at cervical os- removed easily with kelly, no active bleeding, small amount of dark red vaginal bleeding without odor, vaginal walls normal, scant amount of serosanguinous discharge at 2nd degree laceration- healing appropriately, approximated and non tender.   Bimanual exam:  Cervix dilated 1cm, uterus nontender, fundus U/3   Musculoskeletal:        General: No edema. Normal range of motion.  Neurological: She is alert and oriented to person, place, and time.  Psychiatric: She has a normal mood and affect. Her behavior is normal. Thought content normal.   MAU Course  Procedures  MDM Orders Placed This Encounter  Procedures  . Comprehensive metabolic panel  . CBC with Differential/Platelet  . Urinalysis, Routine w reflex microscopic   Labs reviewed:  Results for orders placed or performed during the hospital encounter of 02/21/19 (from the past  24 hour(s))  Comprehensive metabolic panel     Status: Abnormal   Collection Time: 02/21/19  5:10 PM  Result Value Ref Range   Sodium 140 135 - 145 mmol/L   Potassium 4.0 3.5 - 5.1 mmol/L   Chloride 107 98 - 111 mmol/L   CO2 24 22 - 32 mmol/L   Glucose, Bld 101 (H) 70 - 99 mg/dL   BUN 7 6 - 20 mg/dL   Creatinine, Ser 4.09 0.44 - 1.00 mg/dL   Calcium 8.9 8.9 - 81.1 mg/dL   Total Protein 6.6 6.5 - 8.1 g/dL   Albumin 3.3 (L) 3.5 - 5.0 g/dL   AST 21 15 - 41 U/L   ALT 21 0 - 44 U/L  Alkaline Phosphatase 97 38 - 126 U/L   Total Bilirubin 0.3 0.3 - 1.2 mg/dL   GFR calc non Af Amer >60 >60 mL/min   GFR calc Af Amer >60 >60 mL/min   Anion gap 9 5 - 15  CBC with Differential/Platelet     Status: Abnormal   Collection Time: 02/21/19  5:10 PM  Result Value Ref Range   WBC 6.9 4.0 - 10.5 K/uL   RBC 3.57 (L) 3.87 - 5.11 MIL/uL   Hemoglobin 10.9 (L) 12.0 - 15.0 g/dL   HCT 28.0 (L) 03.4 - 91.7 %   MCV 93.3 80.0 - 100.0 fL   MCH 30.5 26.0 - 34.0 pg   MCHC 32.7 30.0 - 36.0 g/dL   RDW 91.5 05.6 - 97.9 %   Platelets 480 (H) 150 - 400 K/uL   nRBC 0.0 0.0 - 0.2 %   Neutrophils Relative % 65 %   Neutro Abs 4.5 1.7 - 7.7 K/uL   Lymphocytes Relative 28 %   Lymphs Abs 1.9 0.7 - 4.0 K/uL   Monocytes Relative 6 %   Monocytes Absolute 0.4 0.1 - 1.0 K/uL   Eosinophils Relative 1 %   Eosinophils Absolute 0.1 0.0 - 0.5 K/uL   Basophils Relative 0 %   Basophils Absolute 0.0 0.0 - 0.1 K/uL   Immature Granulocytes 0 %   Abs Immature Granulocytes 0.03 0.00 - 0.07 K/uL  Urinalysis, Routine w reflex microscopic     Status: Abnormal   Collection Time: 02/21/19  5:36 PM  Result Value Ref Range   Color, Urine YELLOW YELLOW   APPearance HAZY (A) CLEAR   Specific Gravity, Urine 1.002 (L) 1.005 - 1.030   pH 6.0 5.0 - 8.0   Glucose, UA NEGATIVE NEGATIVE mg/dL   Hgb urine dipstick LARGE (A) NEGATIVE   Bilirubin Urine NEGATIVE NEGATIVE   Ketones, ur NEGATIVE NEGATIVE mg/dL   Protein, ur NEGATIVE NEGATIVE  mg/dL   Nitrite NEGATIVE NEGATIVE   Leukocytes,Ua LARGE (A) NEGATIVE   RBC / HPF 6-10 0 - 5 RBC/hpf   WBC, UA 21-50 0 - 5 WBC/hpf   Bacteria, UA RARE (A) NONE SEEN   Squamous Epithelial / LPF 6-10 0 - 5   Mucus PRESENT    CBC and CMP- WNL  Urine culture added and pending at time of discharge.   Discussed results of labs with patient, unlikely of infection. Fatigue d/t patient being exhausted - patient reports only getting 2-3 hours of sleep over the past 2 days, prior to that only getting 1 hour of sleep. Discussed with patient to allow husband and husband's mom to help, encouraged to sleep when help is there.   Educated and discussed use of soap, gentle washing and making sure laceration is dry by patting with towel prior to putting underwear and pad on. Encouraged patient to change pad/panty liner every 2-3 hours.   Discussed with patient breast feeding and encouraging patient to gap pumping overnight, patient reports she is currently waking up every 2-3 hours at night to pump even if not her turn and husband using a bottle from fridge to feed baby. Patient nervous that gap in pumping will effect milk supply. Encouraged patient that one gap from 11pm- 4am so that patient can sleep is not going to effect milk supply, patient verbalizes understanding.   Message sent to office to schedule patient follow up in the office in 10 days. Discussed with patient to call office on Thursday if symptoms worsen, odor increased,  bleeding increases, or increase in abdominal pain.   Discussed reasons to return to MAU. Follow up in office. Return to MAU as needed. Pt stable at time of discharge.   Assessment and Plan   1. Postpartum care following vaginal delivery   2. Physically well but worried    Discharge home Follow up as scheduled in the office - message sent to office for scheduling  Return to MAU as needed for reasons discussed and/or emergencies  Hydration and rest  Laceration care discussed    [facility]. Schedule an appointment as soon as possible for a visit.   Specialty: Obstetrics and Gynecology Why: Make appointment to be seen in the 2nd week of February  Contact information: [place], [place] [phone]         Allergies as of 02/21/2019      Reactions   Methylphenidate Itching   Penicillins Anaphylaxis   Did it involve swelling of the face/tongue/throat, SOB, or low BP? Yes Did it involve sudden or severe rash/hives, skin peeling, or any reaction on the inside of your mouth or nose? Yes Did you need to seek medical attention at a hospital or doctor's office? Yes When did it last happen?In high school If all above answers are "NO", may proceed with cephalosporin use.   Morphine Nausea And Vomiting   Concerta [methylphenidate Hcl Er (cd)] Itching   Dexmethylphenidate Itching   Hydrocodone Nausea And Vomiting   Vyvanse [lisdexamfetamine Dimesylate] Palpitations      Medication List    TAKE these medications   acetaminophen 325 MG tablet Commonly known as: Tylenol Take 2 tablets (650 mg total) by mouth every 4 (four) hours as needed (for pain scale < 4).   alum & mag hydroxide-simeth 200-200-20 MG/5ML suspension Commonly known as: MAALOX/MYLANTA Take 30 mLs by mouth every 6 (six) hours as needed for indigestion or heartburn.   enoxaparin 40 MG/0.4ML injection Commonly known as: LOVENOX Inject 0.4 mLs (40 mg total) into the skin daily.   ibuprofen 600 MG tablet Commonly known as: ADVIL Take 1 tablet (600 mg total) by mouth every 6 (six) hours.   magnesium 30 MG tablet Take 30 mg by mouth 2 (two) times daily.   prenatal multivitamin Tabs tablet Take 1 tablet by mouth at bedtime.       Lajean Manes CNM 02/21/2019, 7:10 PM

## 2019-02-21 NOTE — Discharge Instructions (Signed)
Postpartum Care After Vaginal Delivery This sheet gives you information about how to care for yourself from the time you deliver your baby to up to 6-12 weeks after delivery (postpartum period). Your health care provider may also give you more specific instructions. If you have problems or questions, contact your health care provider. Follow these instructions at home: Vaginal bleeding  It is normal to have vaginal bleeding (lochia) after delivery. Wear a sanitary pad for vaginal bleeding and discharge. ? During the first week after delivery, the amount and appearance of lochia is often similar to a menstrual period. ? Over the next few weeks, it will gradually decrease to a dry, yellow-Prill discharge. ? For most women, lochia stops completely by 4-6 weeks after delivery. Vaginal bleeding can vary from woman to woman.  Change your sanitary pads frequently. Watch for any changes in your flow, such as: ? A sudden increase in volume. ? A change in color. ? Large blood clots.  If you pass a blood clot from your vagina, save it and call your health care provider to discuss. Do not flush blood clots down the toilet before talking with your health care provider.  Do not use tampons or douches until your health care provider says this is safe.  If you are not breastfeeding, your period should return 6-8 weeks after delivery. If you are feeding your child breast milk only (exclusive breastfeeding), your period may not return until you stop breastfeeding. Perineal care  Keep the area between the vagina and the anus (perineum) clean and dry as told by your health care provider. Use medicated pads and pain-relieving sprays and creams as directed.  If you had a cut in the perineum (episiotomy) or a tear in the vagina, check the area for signs of infection until you are healed. Check for: ? More redness, swelling, or pain. ? Fluid or blood coming from the cut or tear. ? Warmth. ? Pus   You may be  given a squirt bottle to use instead of wiping to clean the perineum area after you go to the bathroom. As you start healing, you may use the squirt bottle before wiping yourself. Make sure to wipe gently.  To relieve pain caused by an episiotomy, a tear in the vagina, or swollen veins in the anus (hemorrhoids), try taking a warm sitz bath 2-3 times a day. A sitz bath is a warm water bath that is taken while you are sitting down. The water should only come up to your hips and should cover your buttocks. Breast care  Within the first few days after delivery, your breasts may feel heavy, full, and uncomfortable (breast engorgement). Milk may also leak from your breasts. Your health care provider can suggest ways to help relieve the discomfort. Breast engorgement should go away within a few days.  If you are breastfeeding: ? Wear a bra that supports your breasts and fits you well. ? Keep your nipples clean and dry. Apply creams and ointments as told by your health care provider. ? You may need to use breast pads to absorb milk that leaks from your breasts. ? You may have uterine contractions every time you breastfeed for up to several weeks after delivery. Uterine contractions help your uterus return to its normal size. ? If you have any problems with breastfeeding, work with your health care provider or Advertising copywriter.  If you are not breastfeeding: ? Avoid touching your breasts a lot. Doing this can make your breasts produce  more milk. ? Wear a good-fitting bra and use cold packs to help with swelling. ? Do not squeeze out (express) milk. This causes you to make more milk. Intimacy and sexuality  Ask your health care provider when you can engage in sexual activity. This may depend on: ? Your risk of infection. ? How fast you are healing. ? Your comfort and desire to engage in sexual activity.  You are able to get pregnant after delivery, even if you have not had your period. If desired,  talk with your health care provider about methods of birth control (contraception). Medicines  Take over-the-counter and prescription medicines only as told by your health care provider.  If you were prescribed an antibiotic medicine, take it as told by your health care provider. Do not stop taking the antibiotic even if you start to feel better. Activity  Gradually return to your normal activities as told by your health care provider. Ask your health care provider what activities are safe for you.  Rest as much as possible. Try to rest or take a nap while your baby is sleeping. Eating and drinking   Drink enough fluid to keep your urine pale yellow.  Eat high-fiber foods every day. These may help prevent or relieve constipation. High-fiber foods include: ? Whole grain cereals and breads. ? Sitton rice. ? Beans. ? Fresh fruits and vegetables.  Do not try to lose weight quickly by cutting back on calories.  Take your prenatal vitamins until your postpartum checkup or until your health care provider tells you it is okay to stop. Lifestyle  Do not use any products that contain nicotine or tobacco, such as cigarettes and e-cigarettes. If you need help quitting, ask your health care provider.  Do not drink alcohol, especially if you are breastfeeding. General instructions  Keep all follow-up visits for you and your baby as told by your health care provider. Most women visit their health care provider for a postpartum checkup within the first 3-6 weeks after delivery. Contact a health care provider if:  You feel unable to cope with the changes that your child brings to your life, and these feelings do not go away.  You feel unusually sad or worried.  Your breasts become red, painful, or hard.  You have a fever.  You have trouble holding urine or keeping urine from leaking.  You have little or no interest in activities you used to enjoy.  You have not breastfed at all and you  have not had a menstrual period for 12 weeks after delivery.  You have stopped breastfeeding and you have not had a menstrual period for 12 weeks after you stopped breastfeeding.  You have questions about caring for yourself or your baby.  You pass a blood clot from your vagina. Get help right away if:  You have chest pain.  You have difficulty breathing.  You have sudden, severe leg pain.  You have severe pain or cramping in your lower abdomen.  You bleed from your vagina so much that you fill more than one sanitary pad in one hour. Bleeding should not be heavier than your heaviest period.  You develop a severe headache.  You faint.  You have blurred vision or spots in your vision.  You have bad-smelling vaginal discharge.  You have thoughts about hurting yourself or your baby. If you ever feel like you may hurt yourself or others, or have thoughts about taking your own life, get help right away. You  can go to the nearest emergency department or call:  Your local emergency services (911 in the [place]  A suicide crisis helpline, such as the [facility] at (480)574-3919. This is open 24 hours a day. Summary  The period of time right after you deliver your newborn up to 6-12 weeks after delivery is called the postpartum period.  Gradually return to your normal activities as told by your health care provider.  Keep all follow-up visits for you and your baby as told by your health care provider. This information is not intended to replace advice given to you by your health care provider. Make sure you discuss any questions you have with your health care provider. Document Revised: 01/11/2017 Document Reviewed: 10/22/2016 Elsevier Patient Education  2020 [employer].

## 2019-02-21 NOTE — MAU Note (Signed)
Sabrina Riddle is a 24 y.o. here in MAU reporting: thinks she has a vaginal infection related to delivery. 2 days ago she noticed a foul odor and it has been getting stronger. Also feeling weak. No increased bleeding or pain.   Pt brought infant with her to MAU due to infant being exclusively breastfed.  Informed pt that if she needs to go for imaging, etc that she will need to find someone to watch the child or we will need to contact CSW. Pt verbalizes understanding.   Onset of complaint: 2 days  Pain score: 0/10  Vitals:   02/21/19 1644  BP: 123/73  Pulse: (!) 103  Resp: 16  Temp: 99.2 F (37.3 C)  SpO2: 98%     Lab orders placed from triage: none

## 2019-02-22 ENCOUNTER — Encounter: Payer: Self-pay | Admitting: Certified Nurse Midwife

## 2019-02-22 LAB — CULTURE, OB URINE

## 2019-02-23 ENCOUNTER — Telehealth: Payer: Self-pay | Admitting: *Deleted

## 2019-02-23 NOTE — Telephone Encounter (Signed)
Left patient an urgent message to schedule follow up in the office during week of 2/8-2/11 for assessment of vaginal bleeding, sutures and odor. If possible schedule for 03/05/2019 with Constant at 1:15 or 3:00 PM

## 2019-02-25 ENCOUNTER — Ambulatory Visit: Payer: Self-pay

## 2019-02-25 NOTE — Lactation Note (Signed)
This note was copied from a baby's chart. Lactation Consultation Note  Patient Name: Sabrina Riddle Today's Date: 02/25/2019     02/25/2019  Name: Raziya Aveni MRN: [id number] Date of Birth: 02/15/2020 Gestational Age: Gestational Age: [redacted]w[redacted]d Birth Weight: 87.8 oz Weight today:  Weight: 5 lb 6.4 oz (2450 g)  10 day old LPT infant presents today with mom for feeding assessment. Infant 37 weeks 4 days adjusted age today.   Infant has gained 125 grams in the last 6 days with an average daily weight gain of 21 grams a day. Mom reports lowest weight was 5 pounds on 1/30.   Infant has transitioned to mostly BF with a few bottles a day. Infant is using the Mam bottle as advised by SLP. Infant is being fed upright and paced bottle fed and infant is tolerating well. Reviewed trying a Dr. Saul Fordyce bottle as needed for choking or drooling on the bottle.   Mom did great with applying NS and with latching and feeding infant. Infant is being fed in an upright football hold. Infant chokes some on the breast with letdown, discussed prepumping  About an ounce off if infant continues to choke on the breast.   Infant with thick labial frenulum that inserts at the bottom of the gum ridge. Upper lip blanches with feeding. Infant with sucking blister to upper center lip.  Upper lip with red ring after feeding. Infant with short posterior lingual frenulum. Infant with high palate.  Infant with strong suckle and good tongue extension and lateralization. Infant with some decreased mid tongue elevation. Infant with some choking on the breast. Discussed with mom I feel it is appropriate to allow infant to get to 42 weeks and reassess her suckle and latch and that if at some point she cannot wean off the NS then an evaluation may be warranted. Reviewed how tongue and lip restrictions can effect milk supply, transfer and growth.  Mom given website information and local provider information.   Mom reports infant needs a  NS for each feeding. Changed to a # 24 NS as # 20 is tight for mom. Infant tolerated the change well.   Infant and mom have several factors that interplay with BF:  1. Infant is LPT infant 2. Infant is < 6 pounds 3. Mom is a first time mom with non elastic nipples 4. Infant using a NS with each feeding, not able to latch without it 5. Infant with tongue and lip restrictions of unknown significance at this time. Will reassess as infant get older  Infant is doing extremely well with BF for a LPT infant. Praised mom for all her hard work and efforts.   Mom reports she has good family support and dad is very helpful for mom.   Infant to follow up with [facility] next week. Infant to follow up with Lactation in 1 month.     General Information: Mother's reason for visit: LPT infant, < 6 pounds, feeding assessment Consult: Initial Lactation consultant: Nonah Mattes RN,IBCLC Breastfeeding experience: Latching well and decreasing supplement Maternal medical conditions: Polycystic ovarian syndrome, Infertility(mom with history of fertility treatment, conceived without treatment) Maternal medications: Pre-natal vitamin, Other(MgSo4, Lovenox)  Breastfeeding History: Frequency of breast feeding: every 1-2.5 hours, cluster feeding last night, self awakening Duration of feeding: 20-30 minutes, usually uses both breasts with each feeding  Supplementation: Supplement method: bottle(Mam, level 0 Nipple)         Breast milk volume: 50-60 ml Breast milk frequency:  2 x a day Total breast milk volume per day: 100-120 ml Pump type: Other(Motif Luna) Pump frequency: 7-8 x a day Pump volume: 1-4 ounces  Infant Output Assessment: Voids per 24 hours: 8-10 Urine color: Clear yellow Stools per 24 hours: 8-10 Stool color: Yellow  Breast Assessment: Breast: Filling, Compressible Nipple: Erect Pain level: 0 Pain interventions: Bra, Breast pump, Nipple shield, Coconut oil  Feeding  Assessment: Infant oral assessment: Variance Infant oral assessment comment: see note Positioning: Football(10 minutes) Latch: 2 - Grasps breast easily, tongue down, lips flanged, rhythmical sucking. Audible swallowing: 2 - Spontaneous and intermittent Type of nipple: 2 - Everted at rest and after stimulation Comfort: 2 - Soft/non-tender Hold: 2 - No assistance needed to correctly position infant at breast LATCH score: 10 Latch assessment: Deep Lips flanged: Yes Suck assessment: Displays both Tools: Nipple shield 24 mm Pre-feed weight: 2450 grams Post feed weight: 2492 grams Amount transferred: 42 ml Amount supplemented: 0  Additional Feeding Assessment: Infant oral assessment: Variance Infant oral assessment comment: see note Positioning: Football Latch: 1 - Repeated attempts neede to sustain latch, nipple held in mouth throughout feeding, stimulation needed to elicit sucking reflex. Audible swallowing: 1 - A few with stimulation Type of nipple: 2 - Everted at rest and after stimulation Comfort: 2 - Soft/non-tender Hold: 2 - No assistance needed to correctly position infant at breast LATCH score: 8 Latch assessment: Deep Lips flanged: Yes Suck assessment: Displays both Tools: Nipple shield 24 mm Pre-feed weight: 2492 grams Post feed weight: 2492 grams Amount transferred: 0 Amount supplemented: 0  Totals: Total amount transferred: 42 ml Total supplement given: 0 Total amount pumped post feed: did not pump   Plan:   1. Offer infant the breast with feeding cues 2. Keep infant awake at the breast as needed with feeding 3. Feed infant skin to skin 4. Massage/compress breast with feedings as needed to keep her active 5. Empty the first breast before offering the second breast 6. Offer both breasts with each feeding  7. Continue to use the # 24 Nipple Shield as needed to keep infant latched and Breast feeding. Try each day without it to see when infant is able to begin  feeding without it.  7. Offer infant a bottle of expressed breast milk if still cueing to feed after breast feeding 8. When offering the bottle use the paced bottle feeding method (video on [email or web address]) 9. Infant needs about 45-60 ml (1.5-2 ounces) for 8 feedings a day or 360-480 ml (12-16 ounces) in 24 hours. Infant may take more or less per feeding depending on how often she feeds. Feed infant until infant is satisfied.  10. Continue pumping at least 4 x a day since using the Nipple shield and anytime infant is getting a bottle of supplement. Pump for about 20 minutes with your double electric breast pump.  11. Keep up the good work 12. Thank you for allowing me to assist you today 13. Please call with any questions or concerns as needed [phone] 14. Follow up with Lactation in 1 month  Ed Blalock RN, IBCLC                                                    Ed Blalock 02/25/2019, 10:40 AM

## 2019-03-05 ENCOUNTER — Other Ambulatory Visit: Payer: Self-pay

## 2019-03-05 ENCOUNTER — Encounter: Payer: Self-pay | Admitting: Obstetrics and Gynecology

## 2019-03-05 ENCOUNTER — Ambulatory Visit (INDEPENDENT_AMBULATORY_CARE_PROVIDER_SITE_OTHER): Payer: 59 | Admitting: Obstetrics and Gynecology

## 2019-03-05 VITALS — BP 112/68 | HR 105 | Temp 98.0°F | Resp 16 | Ht 64.0 in | Wt 154.0 lb

## 2019-03-05 DIAGNOSIS — D649 Anemia, unspecified: Secondary | ICD-10-CM

## 2019-03-05 DIAGNOSIS — O9081 Anemia of the puerperium: Secondary | ICD-10-CM | POA: Diagnosis not present

## 2019-03-05 DIAGNOSIS — O909 Complication of the puerperium, unspecified: Secondary | ICD-10-CM

## 2019-03-05 DIAGNOSIS — O9089 Other complications of the puerperium, not elsewhere classified: Secondary | ICD-10-CM

## 2019-03-05 NOTE — Progress Notes (Signed)
24 yo P1 s/p SVD on 1/25 presenting for MAU follow up. Patient was seen on 1/30 for vaginal odor. A blood clot was removed from cervical os and patient reports complete resolution of vaginal odor since. She reports doing well and is receiving help from her mother and husband. She is breastfeed without difficulty. She is not sexually active. Patient does not express concerns about postpartum depression.  Past Medical History:  Diagnosis Date  . ADD (attention deficit disorder)   . Arcuate uterus   . Bicuspid aortic valve   . Cholestasis of pregnancy   . History of DVT (deep vein thrombosis)   . POTS (postural orthostatic tachycardia syndrome)   . Pulmonary embolism (HCC)   . Vitamin B12 deficiency    Past Surgical History:  Procedure Laterality Date  . TYMPANOSTOMY    . WISDOM TOOTH EXTRACTION    . WRIST SURGERY     Family History  Problem Relation Age of Onset  . Hypertension Mother   . Polycystic ovary syndrome Mother   . Kidney cancer Mother   . Diabetes Father   . Testicular cancer Father   . Polycystic ovary syndrome Sister   . Aneurysm Maternal Grandmother   . Aneurysm Paternal Grandmother    Social History   Tobacco Use  . Smoking status: Never Smoker  . Smokeless tobacco: Never Used  Substance Use Topics  . Alcohol use: No  . Drug use: No   ROS See pertinent in HPI  Blood pressure 112/68, pulse (!) 105, temperature 98 F (36.7 C), resp. rate 16, height 5\' 4"  (1.626 m), weight 154 lb (69.9 kg), last menstrual period 06/07/2018, currently breastfeeding. GENERAL: Well-developed, well-nourished female in no acute distress.  ABDOMEN: Soft, nontender, nondistended. No organomegaly. PELVIC: Normal external female genitalia. Vagina is pink and rugated. Second degree healing well. Normal discharge.  EXTREMITIES: No cyanosis, clubbing, or edema, 2+ distal pulses.  A/P 24 yo here as an MAU follow up - Patient is doing well  - Encouraged patient to rest when she can -  Patient to return as scheduled in a few weeks for postpartum visit

## 2019-03-06 LAB — CBC
HCT: 36.7 % (ref 35.0–45.0)
Hemoglobin: 12.1 g/dL (ref 11.7–15.5)
MCH: 30.8 pg (ref 27.0–33.0)
MCHC: 33 g/dL (ref 32.0–36.0)
MCV: 93.4 fL (ref 80.0–100.0)
MPV: 10.5 fL (ref 7.5–12.5)
Platelets: 505 10*3/uL — ABNORMAL HIGH (ref 140–400)
RBC: 3.93 10*6/uL (ref 3.80–5.10)
RDW: 12.6 % (ref 11.0–15.0)
WBC: 4.1 10*3/uL (ref 3.8–10.8)

## 2019-03-10 ENCOUNTER — Ambulatory Visit: Payer: Self-pay

## 2019-03-10 NOTE — Lactation Note (Signed)
This note was copied from a baby's chart. Lactation Consultation Note  Patient Name: Sabrina Riddle Today's Date: 03/10/2019     03/10/2019  Name: Madeline Pho MRN: [id number] Date of Birth: 02/15/2020 Gestational Age: Gestational Age: [redacted]w[redacted]d Birth Weight: 87.8 oz Weight today:  Weight: 6 lb 6.3 oz (6251 g)  43 week old LPT infant presents today with mom for follow up feeding assessment.   Infant has gained 2900 grams in the last 13 days with an average daily weight gain of 35 grams a day.   Mom reports her milk supply has decreased significantly and does not have enough milk to feed infant. She reports she decreased her pumping and also reports she feels infant has signs of a tongue and lip tie. Mom has been using her stored milk to supplement infant.   Infant is BF less as she has become frustrated at the breast. Mom reports she noted big changes over the weekend. Mom reports infant does not usually stay latched well.   Infant is using the # 20 NS as she reports she had pain with the # 24 NS.   Mom decreased pumping aver a week to 3 x a day and is when she noted the difficulties. Infant was fully nursing prior to mom pumping. Infant is not acting satisfied after breast feeding and is wanting to latch all day long.   Infant with thick labial frenulum that inserts at the bottom of the gum ridge. Upper lip blanches with feeding. Infant with sucking blister to upper center lip.  Upper lip with red ring after feeding. Infant with short posterior lingual frenulum. Infant with high palate.  Infant with strong suckle and good tongue extension and lateralization. Infant with some decreased mid tongue elevation. Infant is more frustrated at the breast. Mom reports infant is needing supplement after each BF. Mom reports a decrease in her supply, most notably this past weekend. Mom to follow up with Dr. Lexine Baton this afternoon. Infant clicked on the breast throughout feeding. Infant may possibly be  going through a growth spurt currently causing infant to be asking for more food.   Mom reports she has not resumed her cycle. Mom is spotting small amount Mose discharge at this point. Mom not taking any new medicines. Mom using a pacifier at night as needed after feedings.   Mom tried Fenugreek drink and noted a decrease in supply both days she tried it. Advised pt not to try Fenugreek again. Reviewed Moringa, [employer] Fenugreek Free products, and Reglan. Pt sees Dr. Penne Lash and is planning to call her in regards to Reglan.   Mom has tried Lactation Brownies and did not notice a difference. She is eating Oatmeal and Flax seed every morning. She has been drinking Tour manager daily.   Infant latched to the breast easily with the # 20 NS. Infant sleepy at the breast needing frequent breaks. Mom is feeding infant skin to skin. Mom did well with positioning, supporting and massaging breast with feeding. Infant is not transferring as well as she was and needing a lot of supplement. Mom reports infant has not been BF this well at home.   Infant to follow up with Kemper Durie, PA tomorrow 2/17. Infant to follow up with Lactation on 3/3. Infant to see Dr. Lexine Baton today to evaluate tongue and lip.       General Information: Mother's reason for visit: milk supply issues Consult: Follow-up Lactation consultant: Noralee Stain RN,IBCLC Breastfeeding experience: BF with  some feeding, supplemented with all feedings Maternal medical conditions: Polycystic ovarian syndrome, Infertility Maternal medications: Pre-natal vitamin, Other(MgSo4, Lovenox)  Breastfeeding History: Frequency of breast feeding: infant is feeding every 2.5-3 hours, she is BF about 3-4 times a day, bottle feeding at night, infant awakens every 2 hours at night to feed Duration of feeding: 30-40 minutes and them mom stops BF and offers supplement  Supplementation: Supplement method: bottle(Mam size 0 nipple)         Breast  milk volume: 2-3 ounces depending on if she latches to the breast Breast milk frequency: every 2.5-3 ounces   Pump type: Other(Motif Luna) Pump frequency: every 2.5-3 hours Pump volume: 1.5-2 ounces  Infant Output Assessment: Voids per 24 hours: 10-12 Urine color: Clear yellow Stools per 24 hours: 10-12 Stool color: Yellow  Breast Assessment: Breast: Soft, Compressible Nipple: Erect Pain level: 7(with initial latch, improves with feeding) Pain interventions: Bra, Nipple shield, Breast pump, Expressed breast milk, Coconut oil  Feeding Assessment: Infant oral assessment: Variance Infant oral assessment comment: see note Positioning: Football(right breast, 15 minutes+ 10 minutes) Latch: 1 - Repeated attempts needed to sustain latch, nipple held in mouth throughout feeding, stimulation needed to elicit sucking reflex. Audible swallowing: 2 - Spontaneous and intermittent Type of nipple: 2 - Everted at rest and after stimulation Comfort: 1 - Filling, red/small blisters or bruises, mild/mod discomfort Hold: 2 - No assistance needed to correctly position infant at breast LATCH score: 8 Latch assessment: Deep Lips flanged: Yes Suck assessment: Displays both Tools: Nipple shield 20 mm Pre-feed weight: 2900 grams/2940 grams Post feed weight: 2922 grams/2958 grams Amount transferred: 22 ml Amount supplemented: 0  Additional Feeding Assessment: Infant oral assessment: Variance Infant oral assessment comment: see note Positioning: Football(left breast, 15 minutes) Latch: 1 - Repeated attempts neede to sustain latch, nipple held in mouth throughout feeding, stimulation needed to elicit sucking reflex. Audible swallowing: 2 - Spontaneous and intermittent Type of nipple: 2 - Everted at rest and after stimulation Comfort: 1 - Filling, red/small blisters or bruises, mild/mod discomfort Hold: 2 - No assistance needed to correctly position infant at breast LATCH score: 8 Latch assessment:  Deep Lips flanged: Yes Suck assessment: Displays both Tools: Nipple shield 20 mm Pre-feed weight: 2922 grams/2958 grams Post feed weight: 2940 grams/2966 grams Amount transferred: 18 ml/8 ml Amount supplemented: 0  Totals: Total amount transferred: 66 ml Total supplement given: 0 Total amount pumped post feed: did not pump   Plan:  1. Offer infant the breast with feeding cues 2. Keep infant awake at the breast as needed with feeding 3. Feed infant skin to skin 4. Massage/compress breast with feedings as needed to keep her active 5. Empty the first breast before offering the second breast 6. Offer both breasts with each feeding  7. Continue to use the # 20 Nipple Shield as needed to keep infant latched and breast feeding. Try each day without it to see when infant is able to begin feeding without it. Infant # 20 becomes too tight, try the # 24 again.  7. Offer infant a bottle of expressed breast milk if still cueing to feed after breast feeding 8. When offering the bottle use the paced bottle feeding method (video on [email or web address]) 9. Infant needs about 54-73 ml (2-2.5 ounces) for 8 feedings a day or 435-580 ml (15-19 ounces) in 24 hours. Infant may take more or less per feeding depending on how often she feeds. Feed infant until infant is satisfied.  10. Continue pumping  at least 4 x a day since using the Nipple shield and anytime infant is getting a bottle of supplement. Pump for about 20 minutes with your double electric breast pump.  11. Moringa has worked for some moms to increase milk supply       [employer] has several Fenugreek Free products for increasing milk production       Reglan has been helpful for some mom's to increase milk production. The typical dosage is 10 mg three times a day        for 10-30 days. It needs a prescription from your OB. Some OB's will not prescribe. Side effects are severe                       depression and tardive Diskenesia. 12. Keep up the  good work 13. Thank you for allowing me to assist you today 14. Please call with any questions or concerns as needed [phone] 15. Follow up with Lactation on 3/3  [facility] RN, IBCLC                                                     Silas Flood Yasamin Karel 03/10/2019, 10:22 AM

## 2019-03-11 ENCOUNTER — Telehealth: Payer: Self-pay | Admitting: *Deleted

## 2019-03-11 MED ORDER — METOCLOPRAMIDE HCL 10 MG PO TABS: 10.0000 mg | ORAL_TABLET | Freq: Three times a day (TID) | ORAL | 0 refills | Status: DC

## 2019-03-11 NOTE — Telephone Encounter (Signed)
Pt was seen by lactation consultant and was advised to start Reglan 10 mg TID.  Per VO from Dr Kirkland Hun was sent to pt's pharmacy.

## 2019-03-19 ENCOUNTER — Ambulatory Visit: Payer: Self-pay

## 2019-03-19 NOTE — Lactation Note (Signed)
This note was copied from a baby's chart. Lactation Consultation Note  Patient Name: Sabrina Riddle Today's Date: 03/19/2019     03/19/2019  Name: Sabrina Riddle MRN: [id number] Date of Birth: 02/15/2020 Gestational Age: Gestational Age: [redacted]w[redacted]d Birth Weight: 87.8 oz Weight today:  Weight: 7 lb 0.8 oz (2120 g)  37 week old ET infant presents today for follow up feeding assessment. Infant now 40 weeks 5 days adjusted age. Infant post tongue and lip releases on 2/22 by Dr. Lexine Baton. Mom reports that she has discovered that her husband's family has a history of tongue and lip ties.   Infant has gained 298 grams in the last 9 days with an average daily weight gain of 33 grams a day.  Mom reports she has noticed some improvement with feedings. She has tried to latch without the NS and infant did latch briefly. Reviewed with mom it will take up to about 4 weeks to see the benefit of the procedure. Mom reports infant is more efficient. She is still using frozen milk to supplement, she has been able to decrease the amount of frozen milk she is using.   Mom was needing to supplementing after each feeding last week and has noticed that she has been able to wean off some supplement yesterday. Infant is still getting bottles at night still.   Infant with granulation tissue to upper lip. Upper lip with better flanging today. Infant with small diamond shaped area under tongue. Infant with better tongue elevation. Infant with strong suckle on gloved finger. Mom reports infant is more efficient since releases.  Mom is performing stretches per Dr. Lexine Baton. Suck training taught to begin today.   Infant to follow up with Kemper Durie, PA next week. Infant to follow up with Dr. Lexine Baton in the next week or so. Infant to follow up with Lactation as needed or if infant unable to wean off the Nipple Shield in the next 2-3 weeks.  General Information: Mother's reason for visit: Feeding assessment post tongue and lip  releases on 2/22 Consult: Follow-up Lactation consultant: Jasmine December Sabrina Rosenwald RN,IBCLC Breastfeeding experience: BF is improving, still supplementing Maternal medical conditions: Polycystic ovarian syndrome, Infertility Maternal medications: Pre-natal vitamin, Other(MgSo4, Lovenox, MOringa, [employer] Liquid Gold)  Breastfeeding History: Frequency of breast feeding: every 2-3 hours during the day (Breastfeeding) and every 3-4 hours at night (bottle feeding) Duration of feeding: 30-45 minutes at the breast  Supplementation: Supplement method: bottle(Mam 0 flow , tolerating well)         Breast milk volume: 1.5-2 ounces after BF and 2-3 ounces when bottle feeding Breast milk frequency: 2 x a during the day and every feeding at night   Pump type: Other(Motif Luna) Pump frequency: every 3 hours Pump volume: 1.5-2.5 ounces  Infant Output Assessment: Voids per 24 hours: 8+ Urine color: Clear yellow Stools per 24 hours: 8+ Stool color: Yellow  Breast Assessment: Breast: Soft, Compressible Nipple: Erect Pain level: 0 Pain interventions: Bra, Nipple shield, Breast pump, Expressed breast milk, Coconut oil  Feeding Assessment: Infant oral assessment: Variance Infant oral assessment comment: see note Positioning: Cradle(right breast, 20 minutes) Latch: 2 - Grasps breast easily, tongue down, lips flanged, rhythmical sucking. Audible swallowing: 2 - Spontaneous and intermittent Type of nipple: 2 - Everted at rest and after stimulation Comfort: 2 - Soft/non-tender Hold: 2 - No assistance needed to correctly position infant at breast LATCH score: 10 Latch assessment: Deep Lips flanged: Yes Suck assessment: Displays both Tools: Nipple shield 20  mm Pre-feed weight: 3198 grams Post feed weight: 3238 grams Amount transferred: 40 ml Amount supplemented: 0  Additional Feeding Assessment: Infant oral assessment: Variance Infant oral assessment comment: see note Positioning: Cross  cradle Latch: 2 - Grasps breast easily, tongue down, lips flanged, rhythmical sucking. Audible swallowing: 2 - Spontaneous and intermittent Type of nipple: 2 - Everted at rest and after stimulation Comfort: 2 - Soft/non-tender Hold: 2 - No assistance needed to correctly position infant at breast LATCH score: 10 Latch assessment: Deep Lips flanged: Yes Suck assessment: Displays both Tools: Nipple shield 20 mm Pre-feed weight: 3238 grams Post feed weight: 3252 grams Amount transferred: 14 ml Amount supplemented: 0  Totals: Total amount transferred: 54 ml Total supplement given: 0 Total amount pumped post feed: did not pump   Plan:  1. Offer infant the breast with feeding cues 2. Keep infant awake at the breast as needed with feeding 3. Feed infant skin to skin 4. Massage/compress breast with feedings as needed to keep her active 5. Empty the first breast before offering the second breast 6. Offer both breasts with each feeding  7. Continue to use the # 20 Nipple Shield as needed to keep infant latched and breast feeding. Try each day without it to see when infant is able to begin feeding without it. Infant # 20 becomes too tight, try the # 24 again.  7. Offer infant a bottle of expressed breast milk if still cueing to feed after breast feeding 8. When offering the bottle use the paced bottle feeding method (video on [email or web address]) 9. Infant needs about 58-78 ml (2-2.5 ounces) for 8 feedings a day or 465-620 ml (16-21 ounces) in 24 hours. Infant may take more or less per feeding depending on how often she feeds. Feed infant until infant is satisfied.  10. Continue pumping at least 4 x a day since using the Nipple shield and anytime infant is getting a bottle of supplement. Pump for about 20 minutes with your double electric breast pump.  11. Continue stretches per Dr. Audie Pinto 12. Suck training 5-6 times a day for 1-2 minute per exercise for 2-3 weeks or until suckle improves 13. Keep up  the good work 58. Thank you for allowing me to assist you todayor if not weaning off the nipple shield in the next few weeks 15. Please call with any questions or concerns as needed [phone] 16. Follow up with Lactation as needed or if infant unable to wean off the Nipple Shield in the next 2-3 weeks  Donn Pierini RN, IBCLC                                                      Sabrina Riddle Sabrina Riddle 03/19/2019, 9:29 AM

## 2019-03-30 ENCOUNTER — Telehealth (INDEPENDENT_AMBULATORY_CARE_PROVIDER_SITE_OTHER): Payer: 59 | Admitting: Obstetrics & Gynecology

## 2019-03-30 ENCOUNTER — Encounter: Payer: Self-pay | Admitting: Obstetrics & Gynecology

## 2019-03-30 DIAGNOSIS — Z86718 Personal history of other venous thrombosis and embolism: Secondary | ICD-10-CM

## 2019-03-30 NOTE — Progress Notes (Signed)
TELEHEALTH POSTPARTUM VIRTUAL VIDEO VISIT ENCOUNTER NOTE  Provider location: Center for [employer] [facility] at [place]   I connected with Sabrina Riddle on 03/30/19 at  8:30 AM EST by MyChart Video Encounter at home and verified that I am speaking with the correct person using two identifiers.    I discussed the limitations, risks, security and privacy concerns of performing an evaluation and management service virtually and the availability of in person appointments. I also discussed with the patient that there may be a patient responsible charge related to this service. The patient expressed understanding and agreed to proceed.  Chief Complaint: Postpartum Visit  History of Present Illness: Sabrina Riddle is a 24 y.o. G1P0101 being evaluated for postpartum followup.    She is s/p normal spontaneous vaginal delivery on 02/15/19 at 36 1/7 weeks; she was discharged to home on PPD#2. Pregnancy complicated by cholestasis, history of DVT. Baby is doing well, had to have lip/tongue revision procedures but doing very well now.  Complains of milk blisters which are uncomfortable.   Vaginal bleeding or discharge: Yes , currently on period Intercourse: No  Contraception: condoms, fertility awareness methods Mode of feeding infant: Breast PP depression s/s: No .  Any bowel or bladder issues: No  Pap smear: no abnormalities (date: 10/13/2018)  Review of Systems:  Her 12 point review of systems is negative or as noted in the History of Present Illness.  Patient Active Problem List   Diagnosis Date Noted  . Postpartum care following vaginal delivery 02/17/2019  . Postpartum hemorrhage 02/17/2019  . Cholestasis during pregnancy, antepartum 12/20/2018  . Anemia affecting pregnancy in third trimester 12/16/2018  . Rh negative state in antepartum period 12/15/2018  . Supervision of high risk pregnancy, antepartum 10/10/2018  . Postural orthostatic tachycardia syndrome 06/25/2018   . History of DVT (deep vein thrombosis) 09/27/2016  . Tachycardia 07/25/2015  . ADD (attention deficit disorder) 02/11/2014    Medications Pallavi Clifton. Veenstra had no medications administered during this visit. Current Outpatient Medications  Medication Sig Dispense Refill  . acetaminophen (TYLENOL) 325 MG tablet Take 2 tablets (650 mg total) by mouth every 4 (four) hours as needed (for pain scale < 4). 30 tablet 0  . enoxaparin (LOVENOX) 40 MG/0.4ML injection Inject 0.4 mLs (40 mg total) into the skin daily. 18 mL 0  . ibuprofen (ADVIL) 600 MG tablet Take 1 tablet (600 mg total) by mouth every 6 (six) hours. 30 tablet 0  . magnesium 30 MG tablet Take 30 mg by mouth 2 (two) times daily.    . metoCLOPramide (REGLAN) 10 MG tablet Take 1 tablet (10 mg total) by mouth 3 (three) times daily before meals. 90 tablet 0  . Prenatal Vit-Fe Fumarate-FA (PRENATAL MULTIVITAMIN) TABS tablet Take 1 tablet by mouth at bedtime.     No current facility-administered medications for this visit.    Allergies Methylphenidate, Penicillins, Morphine, Concerta [methylphenidate hcl er (cd)], Dexmethylphenidate, Hydrocodone, and Vyvanse [lisdexamfetamine dimesylate]  Physical Exam:  LMP 06/07/2018  General:  Alert, oriented and cooperative. Patient is in no acute distress.  Mental Status: Normal mood and affect. Normal behavior. Normal judgment and thought content.   Respiratory: Normal respiratory effort noted, no problems with respiration noted  Rest of physical exam deferred due to type of encounter  PP Depression Screening:   Edinburgh Postnatal Depression Scale Screening Tool 03/30/2019 02/16/2019 02/15/2019 02/15/2019  I have been able to laugh and see the funny side of things. 0 0 (No Data) (  No Data)  I have looked forward with enjoyment to things. 0 0 - -  I have blamed myself unnecessarily when things went wrong. 0 0 - -  I have been anxious or worried for no good reason. 2 0 - -  I have felt scared or  panicky for no good reason. 0 0 - -  Things have been getting on top of me. 0 0 - -  I have been so unhappy that I have had difficulty sleeping. 0 0 - -  I have felt sad or miserable. 0 0 - -  I have been so unhappy that I have been crying. 0 0 - -  The thought of harming myself has occurred to me. 0 0 - -  Edinburgh Postnatal Depression Scale Total 2 0 - -     Assessment:Patient is a 24 y.o. G1P0101 who is 6 weeks postpartum from a SVD.  She is doing well.   Plan:  1. History of DVT (deep vein thrombosis) Already completed six week postpartum course of Lovenox, no concerns  2. Postpartum care following vaginal delivery Encouraged coconut oil application, warm compresses to milk blisters. Also advised to call Lactation Consultant for further recommendations.    RTC as needed.  I discussed the assessment and treatment plan with the patient. The patient was provided an opportunity to ask questions and all were answered. The patient agreed with the plan and demonstrated an understanding of the instructions.   The patient was advised to call back or seek an in-person evaluation/go to the ED for any concerning postpartum symptoms.  I provided 7 minutes of face-to-face time during this encounter.   Jaynie Collins, MD Center for [employer], [facility]

## 2019-03-30 NOTE — Patient Instructions (Signed)
Return to clinic for any scheduled appointments or for any gynecologic concerns as needed.   

## 2019-05-01 ENCOUNTER — Ambulatory Visit: Payer: Self-pay

## 2019-05-01 NOTE — Lactation Note (Signed)
This note was copied from a baby's chart. 05/01/2019  Name: Sabrina Riddle MRN: [id number] Date of Birth: 02/15/2020 Gestational Age: Gestational Age: [redacted]w[redacted]d Birth Weight: 87.8 oz Weight today:  Weight: 8 lb 4.4 oz (3752 g)   Mom concerned due to decrease in voids and wt. Gain from pediatrician appt.  Mom is no longer using the nipple shield.  She states Vena Austria is sometime falling asleep at the breast after 7-10 minutes but will also feed for 20 at times.  She feels she eats longer and more enthusiastically in the morning time and is fussier and feeds worse in the afternoon and evening.  She states infant feeds approx. 10 times a day or more.  Mom also has started her second period since baby's birth.  Mom is taking Calcium Magnesium supplement to help with decreased supply during her cycle.    Infant latched easily to right side in cradle.  Mom did assist with flanging lip.  Swallows were heard with good rhythmic jaw movement noted.  After about 7 minutes, infant feel asleep occasionally sucking, (non nutritive).  LC encouraged mom to use compression and breast massage and infant began swallowing and actively feeding.  She required stimulation throughout the feed. Mom states she normally does not stay at the breast this long and that this was a great feed on the right side.  Infant transferred 61ml in 25 minutes then was placed on the other side which mom says she feels infant feeds better.  Infant latched, again needing flanging of top lip. Infant 10 minutes then fell asleep.  43ml were transferred.  Infant still appeared hungry.  Mom is pumping only 1 time daily.  LC encouraged her to increase to 4-6.  Mom feels she will try to get in 4 a day.  Mom is getting donor breastmilk this weekend to supplement with while away from baby for a wedding.  LC recommended mom try to power pump twice daily for a few days.  Also highly recommended mom work more to keep infant awake and active at the breast by using  massage and breast compression during the feeding.    Mom concerned about when unable to use her DEBP how she will pump.  LC provided mom with a hand pump and mom requested instructions on use.  LC reviewed use and mom demonstrated.  24 size flange fit appropriately.  LC encouraged mom to bottle feed with expressed breastmilk, donor milk, or formula after each breastfeed until infant is satiated.   Infant may take 2-3 ounces after each breastfeed.  If mom feels infant feeds longer with audible swallows and active sucking during the feed, 1-2 ounces may be adequate.     Mom was encouraged to follow up in 2 weeks for feeding assessment.    General Information: Mother's reason for visit: feeding assessment Consult: Follow-up Lactation consultant: Tresa Endo Klare Criss, IBCLC, RN) Breastfeeding experience: infant feeding 10 minimun breastfeeds a day.  feeds often, fussier in evenings at breast.   Maternal medications: Pre-natal vitamin(lengindairy milkapaloza, moringa, calcium/mag sulfate,)  Breastfeeding History: Frequency of breast feeding: 10 times or more in 24 hours Duration of feeding: 20-30 minutes, then often snacks 5-10 minutes  Supplementation: Supplement method: bottle         Breast milk volume: 1.5-2 ounces Breast milk frequency: 1 time a day(mom nurses before and after that supplement) Total breast milk volume per day: 1.5-2 ounces Pump type: (motif luna) Pump frequency: once daily Pump volume: 1.5-2 ounces  Infant  Output Assessment: Voids per 24 hours: 8 Urine color: Clear yellow Stools per 24 hours: 4 but varies Stool color: Yellow  Breast Assessment: Breast: Soft(full at night) Nipple: Erect Pain level: 0    Feeding Assessment: Infant oral assessment: WNL   Positioning: Cradle Latch: 2 - Grasps breast easily, tongue down, lips flanged, rhythmical sucking. Audible swallowing: 2 - Spontaneous and intermittent Type of nipple: 2 - Everted at rest and after  stimulation Comfort: 1 - Filling, red/small blisters or bruises, mild/mod discomfort Hold: 1 - Assistance needed to correctly position infant at breast and maintain latch LATCH score: 8 Latch assessment: Deep Lips flanged: No(top lip needs flanging by mom)     Pre-feed weight: 3752 Post feed weight: 3782 Amount transferred: 30    Additional Feeding Assessment: Infant oral assessment: WNL Infant oral assessment comment: somewhat tight labial frenulum Positioning: Cradle(left side) Latch: 2 - Grasps breast easily, tongue down, lips flanged, rhythmical sucking. Audible swallowing: 1 - A few with stimulation Type of nipple: 2 - Everted at rest and after stimulation Comfort: 2 - Soft/non-tender Hold: 1 - Assistance needed to correctly position infant at breast and maintain latch LATCH score: 8 Latch assessment: Deep Lips flanged: (top lip needs flanging) Suck assessment: Displays both   Pre-feed weight: 3782 Post feed weight: 3812 Amount transferred: 30    Totals: Total amount transferred: 30  Total amount transferred 60 ml   Total amount pumped post feed: 6(mom used hand pump)   1.  Offer breast with cues and use compression and massage to keep infant actively feeding at breast.   2.  Work to keep infant awake at the breast and listen for swallows. 3.  After infant finishes feeding at the breast, supplementation of EBM, donor milk, or formula of 19-25 ounces per day. 4.  Infant needs 71-95 mls, ( 2.3-3.2 ounces) per feed for 8 feeds a day.  This is a total of 19-25 ounces, (570-760 mls), per day.   5.  Increase supplementing with bottle after each breastfeed until infant is satisfied, 1-3 ounces per feed.  For a feeding like today, she would need an added 1.5 ounce. But is falling asleep and not feeding long, 3 may be needed. 6.  Continue pumping and increase from 1 pump a day to 4-6 times a day.  7.  Try haaka on first breast you fed on after feeding on second side. 8.  Add  power pumping twice a day and try to make on of your pumps early in the morning after first breastfeed of the day.  9.  Follow up visit for 2 weeks and call [phone] if questions or concerns arise before. 10.  Follow up with pediatrician if voids do not increase with supplementation after each breastfeed.     Forestine Chute RN, [place]                                                  Lactation Consultation Note  Patient Name: Ailin Rochford Today's Date: 05/01/2019     Maternal Data    Feeding    LATCH Score                   Interventions    Lactation Tools Discussed/Used     Consult Status      Ferne Coe [facility] 05/01/2019, 9:22  AM    

## 2019-06-04 ENCOUNTER — Telehealth (INDEPENDENT_AMBULATORY_CARE_PROVIDER_SITE_OTHER): Payer: 59 | Admitting: Obstetrics and Gynecology

## 2019-06-04 ENCOUNTER — Encounter: Payer: Self-pay | Admitting: Obstetrics and Gynecology

## 2019-06-04 DIAGNOSIS — Z9189 Other specified personal risk factors, not elsewhere classified: Secondary | ICD-10-CM | POA: Diagnosis not present

## 2019-06-04 NOTE — Progress Notes (Signed)
Pt wants to discuss breastfeeding and her period

## 2019-06-04 NOTE — Progress Notes (Signed)
    GYNECOLOGY VIRTUAL VISIT ENCOUNTER NOTE  Provider location: Center for [facility] at [place]   I connected with Sabrina Riddle on 06/10/19 at  3:00 PM EDT by MyChart Video Encounter at home and verified that I am speaking with the correct person using two identifiers.   I discussed the limitations, risks, security and privacy concerns of performing an evaluation and management service virtually and the availability of in person appointments. I also discussed with the patient that there may be a patient responsible charge related to this service. The patient expressed understanding and agreed to proceed.   History:  Sabrina Riddle is a 24 y.o. G70P0101 female being evaluated today for issues with breastfeeding. She is having drop in breast milk supply 3-4 days before period, supply low during entire period. Then day or two after period ends, her supply improves. Having to supplement during this time. First period after delivery was bad, now is not heavy or bad cramping.  Taking supplements to help with supply. Has seen lactation for same issue. Having trouble losing baby weight but has not been cutting diet much.   She denies any abnormal vaginal discharge, bleeding, pelvic pain or other concerns.       Past Medical History:  Diagnosis Date  . ADD (attention deficit disorder)   . Arcuate uterus   . Bicuspid aortic valve   . Cholestasis of pregnancy   . History of DVT (deep vein thrombosis)   . POTS (postural orthostatic tachycardia syndrome)   . Pulmonary embolism (HCC)   . Vitamin B12 deficiency    Past Surgical History:  Procedure Laterality Date  . TYMPANOSTOMY    . WISDOM TOOTH EXTRACTION    . WRIST SURGERY     The following portions of the patient's history were reviewed and updated as appropriate: allergies, current medications, past family history, past medical history, past social history, past surgical history and problem list.   Review of  Systems:  Pertinent items noted in HPI and remainder of comprehensive ROS otherwise negative.  Physical Exam:   General:  Alert, oriented and cooperative. Patient appears to be in no acute distress.  Mental Status: Normal mood and affect. Normal behavior. Normal judgment and thought content.   Respiratory: Normal respiratory effort, no problems with respiration noted  Rest of physical exam deferred due to type of encounter  Labs and Imaging No results found for this or any previous visit (from the past 336 hour(s)). No results found.     Assessment and Plan:     1. Breastfeeding problem Will obtain labs Sent message via mychart reviewing options, supplements she can take (Extra calcium, extra protein, red raspberry supplement), try short course of progesterone supplementation via POPs, reglan or just try extra pumping the week before her period   I discussed the assessment and treatment plan with the patient. The patient was provided an opportunity to ask questions and all were answered. The patient agreed with the plan and demonstrated an understanding of the instructions.   The patient was advised to call back or seek an in-person evaluation/go to the ED if the symptoms worsen or if the condition fails to improve as anticipated.  I provided 12 minutes of face-to-face time during this encounter.   Conan Bowens, MD Center for [facility], [facility]

## 2019-06-15 ENCOUNTER — Telehealth: Payer: 59 | Admitting: Obstetrics & Gynecology

## 2019-06-16 ENCOUNTER — Other Ambulatory Visit: Payer: Self-pay

## 2019-06-16 ENCOUNTER — Other Ambulatory Visit (INDEPENDENT_AMBULATORY_CARE_PROVIDER_SITE_OTHER): Payer: 59

## 2019-06-16 ENCOUNTER — Other Ambulatory Visit: Payer: Self-pay | Admitting: Obstetrics and Gynecology

## 2019-06-16 DIAGNOSIS — Z0000 Encounter for general adult medical examination without abnormal findings: Secondary | ICD-10-CM

## 2019-06-16 LAB — IRON,TIBC AND FERRITIN PANEL
%SAT: 39 % (calc) (ref 16–45)
Ferritin: 17 ng/mL (ref 16–154)
Iron: 142 ug/dL (ref 40–190)
TIBC: 364 mcg/dL (calc) (ref 250–450)

## 2019-06-16 LAB — CBC
HCT: 40.5 % (ref 35.0–45.0)
Hemoglobin: 13.2 g/dL (ref 11.7–15.5)
MCH: 29.7 pg (ref 27.0–33.0)
MCHC: 32.6 g/dL (ref 32.0–36.0)
MCV: 91.2 fL (ref 80.0–100.0)
MPV: 10.2 fL (ref 7.5–12.5)
Platelets: 359 10*3/uL (ref 140–400)
RBC: 4.44 10*6/uL (ref 3.80–5.10)
RDW: 11.9 % (ref 11.0–15.0)
WBC: 3.7 10*3/uL — ABNORMAL LOW (ref 3.8–10.8)

## 2019-06-16 LAB — TSH: TSH: 0.93 mIU/L

## 2019-06-16 NOTE — Progress Notes (Signed)
Pt here for lab only 

## 2019-06-16 NOTE — Progress Notes (Signed)
Pt given lab orders and sent to lab 

## 2019-11-11 ENCOUNTER — Other Ambulatory Visit (INDEPENDENT_AMBULATORY_CARE_PROVIDER_SITE_OTHER): Payer: 59

## 2019-11-11 DIAGNOSIS — N912 Amenorrhea, unspecified: Secondary | ICD-10-CM

## 2020-01-11 ENCOUNTER — Encounter: Payer: Self-pay | Admitting: Obstetrics & Gynecology

## 2020-01-11 ENCOUNTER — Ambulatory Visit (INDEPENDENT_AMBULATORY_CARE_PROVIDER_SITE_OTHER): Payer: 59 | Admitting: Obstetrics & Gynecology

## 2020-01-11 ENCOUNTER — Other Ambulatory Visit: Payer: Self-pay

## 2020-01-11 VITALS — BP 123/73 | HR 91 | Ht 64.0 in | Wt 153.0 lb

## 2020-01-11 DIAGNOSIS — Z6791 Unspecified blood type, Rh negative: Secondary | ICD-10-CM | POA: Insufficient documentation

## 2020-01-11 DIAGNOSIS — E282 Polycystic ovarian syndrome: Secondary | ICD-10-CM

## 2020-01-11 DIAGNOSIS — N926 Irregular menstruation, unspecified: Secondary | ICD-10-CM | POA: Diagnosis not present

## 2020-01-11 DIAGNOSIS — N914 Secondary oligomenorrhea: Secondary | ICD-10-CM

## 2020-01-11 LAB — PROLACTIN: Prolactin: 3 ng/mL

## 2020-01-11 LAB — TSH: TSH: 1.44 mIU/L

## 2020-01-11 LAB — POCT URINE PREGNANCY: Preg Test, Ur: NEGATIVE

## 2020-01-11 NOTE — Progress Notes (Signed)
   Subjective:    Patient ID: Sabrina Riddle, female    DOB: Sep 19, 1995, 24 y.o.   MRN: [id number]  HPI  24 yo female presents for period irregularity.  Pt had irregular menses prior to fertility treatment.  Pt used clomid, then metformin and femara for 6 months.  Pt stopped then became pregnant on her own.  Dr. Jeannie Fend diagnosesed her with PCOS.   After delivery, she had monthly menses while breast feeding.  When she stopped breast feeding her periods became irregular again.  Pt takes ovulation strips and ovulated day 46 and should get menses this week.  Pt checks her cervix and position and thinks her cervix is too high.    Review of Systems  Constitutional: Negative.   Respiratory: Negative.   Cardiovascular: Negative.   Gastrointestinal: Negative.   Genitourinary: Negative.        Objective:   Physical Exam Vitals reviewed.  Constitutional:      General: She is not in acute distress.    Appearance: She is well-developed and well-nourished.  HENT:     Head: Normocephalic and atraumatic.  Eyes:     Conjunctiva/sclera: Conjunctivae normal.  Cardiovascular:     Rate and Rhythm: Normal rate.  Pulmonary:     Effort: Pulmonary effort is normal.  Abdominal:     General: Abdomen is flat. There is no distension.     Palpations: Abdomen is soft.     Tenderness: There is no abdominal tenderness.  Genitourinary:    Comments: Tanner V Vulva:  No lesion Vagina:  Pink, no lesions, no discharge, no blood Cervix:  No CMT Uterus:  Non tender, mobile Right adnexa--non tender, no mass Left adnexa--non tender, no mass  Musculoskeletal:        General: No edema.  Skin:    General: Skin is warm and dry.  Neurological:     Mental Status: She is alert and oriented to person, place, and time.  Psychiatric:        Mood and Affect: Mood and affect normal.    Vitals:   01/11/20 1044  BP: 123/73  Pulse: 91  Weight: 153 lb (69.4 kg)  Height: 5\' 4"  (1.626 m)       Assessment & Plan:   Sabrina Riddle is a 24 year old female with PCOS.  Patient experiencing menstrual irregularities as described above.  1.  Does have some nipple discharge when expressed.  She has quit breast-feeding in July.  We will check a TSH and prolactin at this time. 2.  Patient does not respond well to progesterone only pills.  He calls her to have emotional lability.  She has a contraindication to estrogen-containing pills due to history of a DVT.  She is not interested in a Mirena at this time.  We discussed weight loss as a potential method to help her ovulate again.  She just joined a gym.  Her goal weight is 130 pounds. 3.  Patient understands the importance of ovulation to prevent endometrial hyperplasia and endometrial cancer.  She does not have an ovulation every 3 months, we will give her Provera withdrawal bleed.  Patient will keep August informed of her menstrual cycle and progress on weight loss.  35 minutes were spent with patient during the visit as well as record review and documentation.

## 2020-01-11 NOTE — Progress Notes (Signed)
Pt states period is not heavy or painful but, starting in August her periods became irregular. Pt stopped breast feeding in July. Pt had period starting on 8/27 and then didn't have another period until 10/22 and hasn't had a period since then

## 2020-01-15 IMAGING — US US MFM FETAL BPP W/O NON-STRESS
1 series · 12 of 27 positions shown · non-contrast
Comparison: none

[Series 1: us mfm fetal bpp w/o non-stress · 27 acquisitions, 12 frames shown]
[im 2/27]
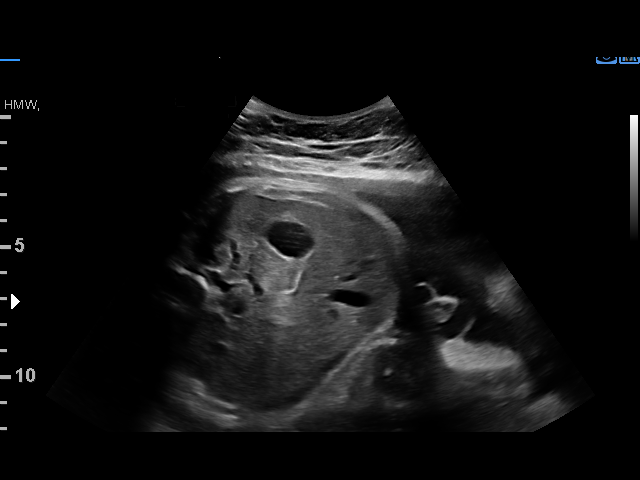
[im 4/27]
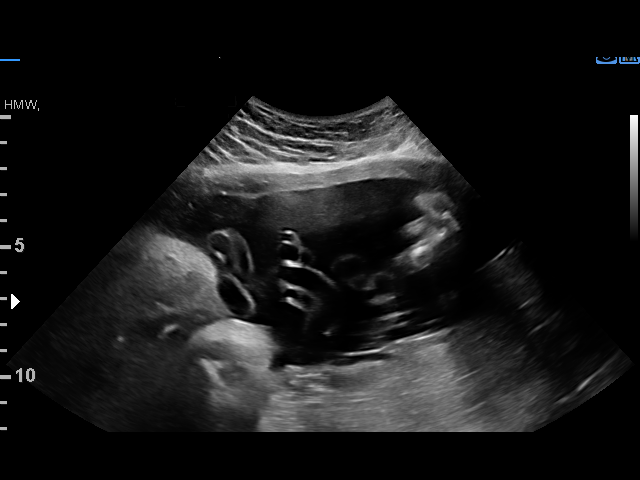
[im 6/27]
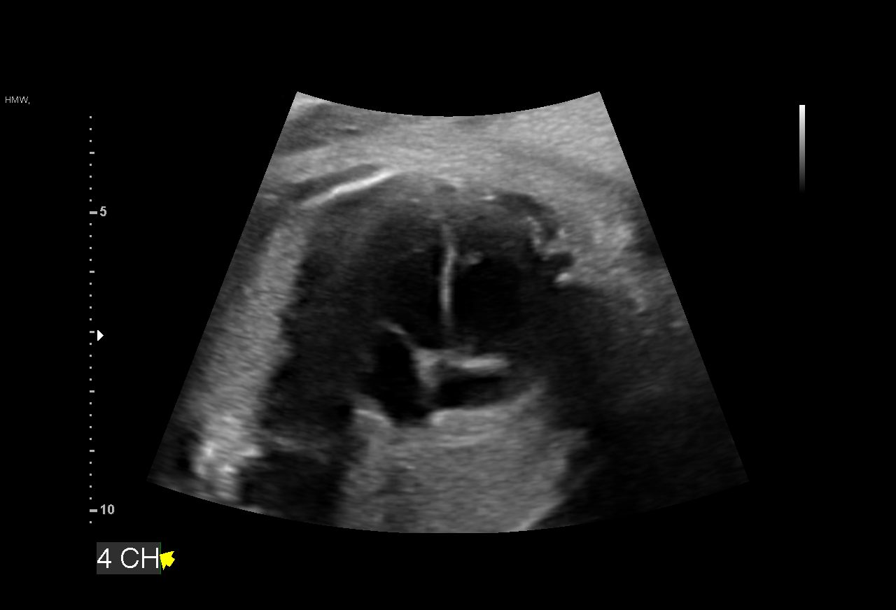
[im 8/27]
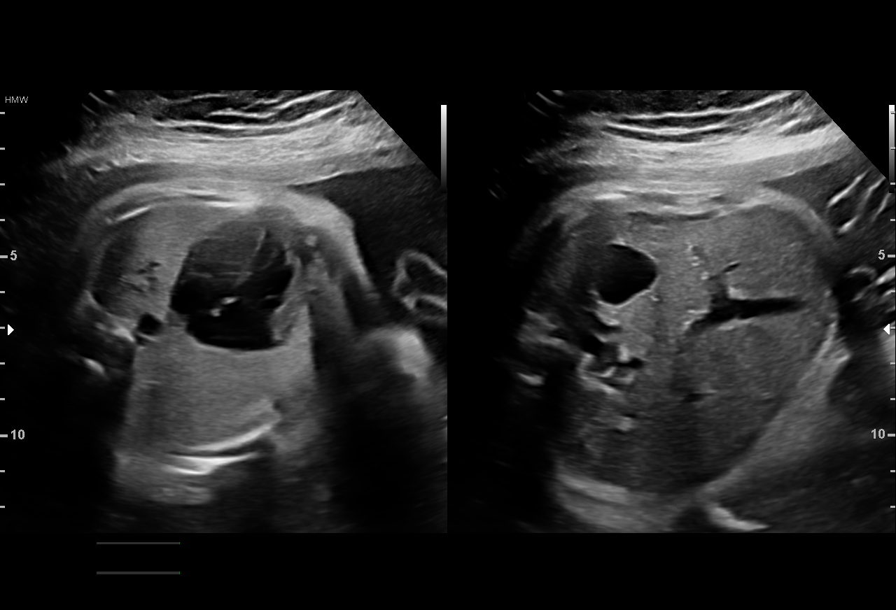
[im 11/27]
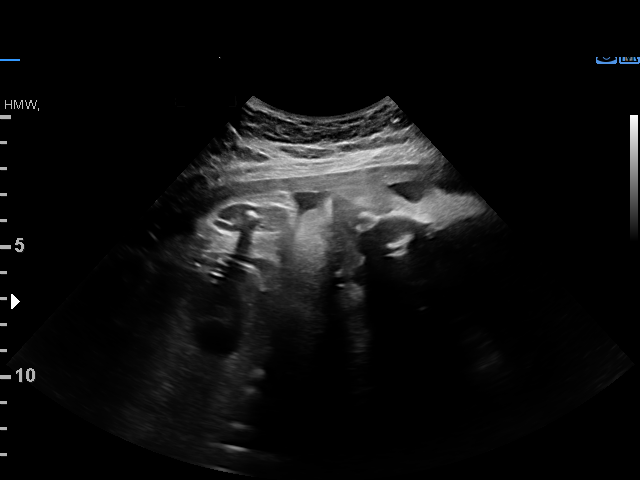
[im 13/27]
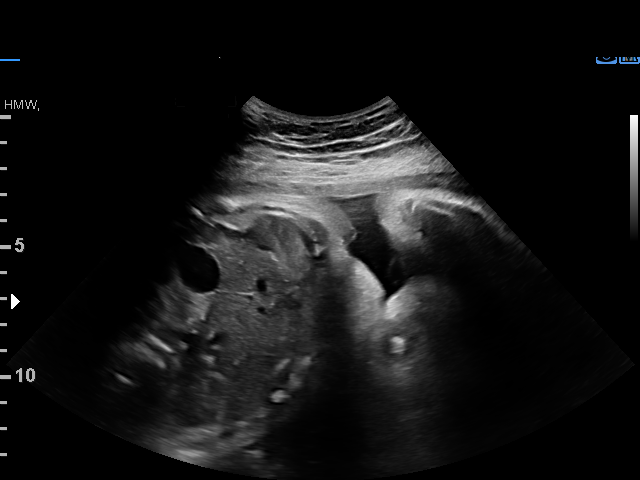
[im 15/27]
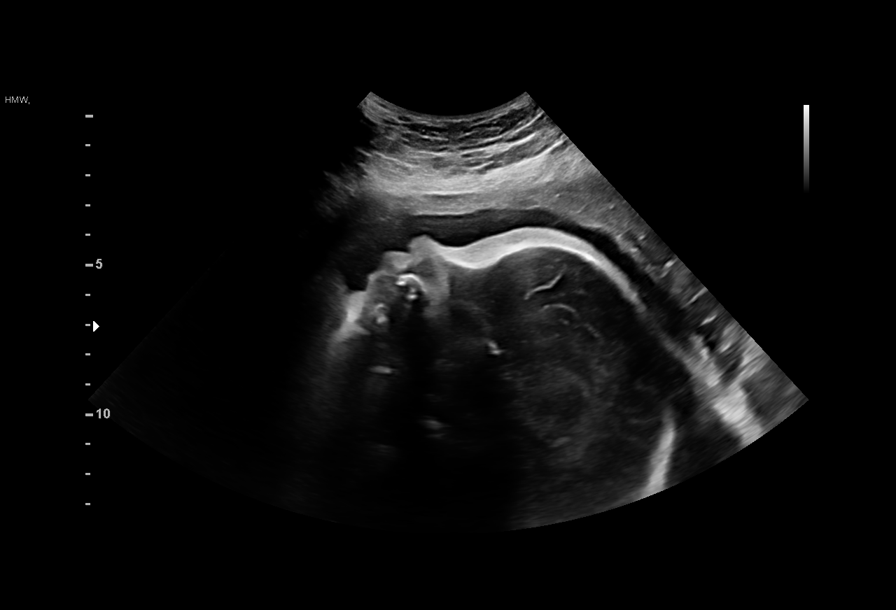
[im 17/27]
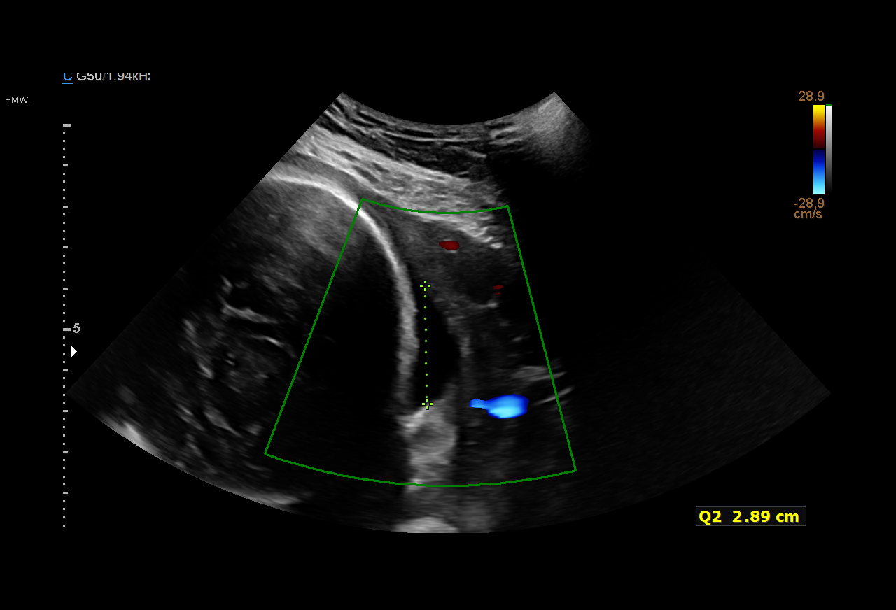
[im 20/27]
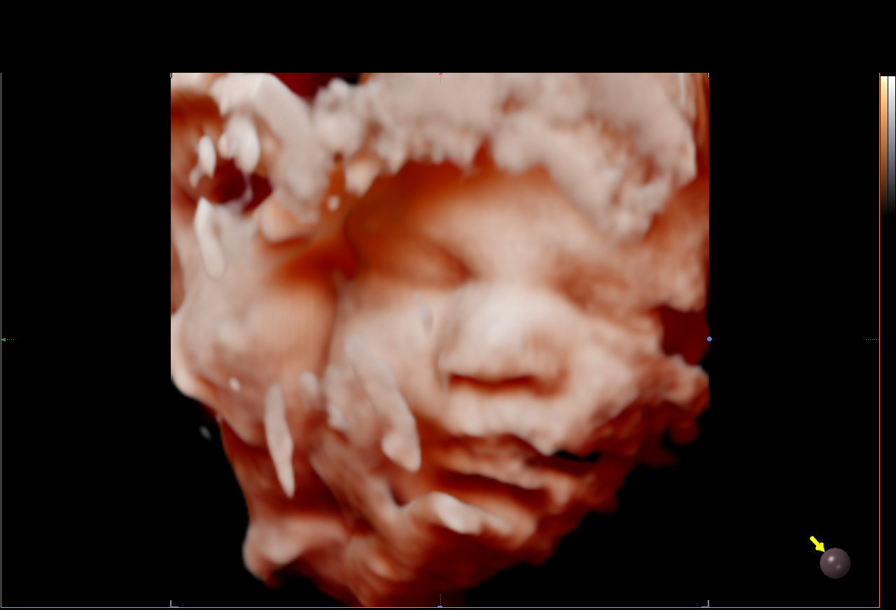
[im 22/27]
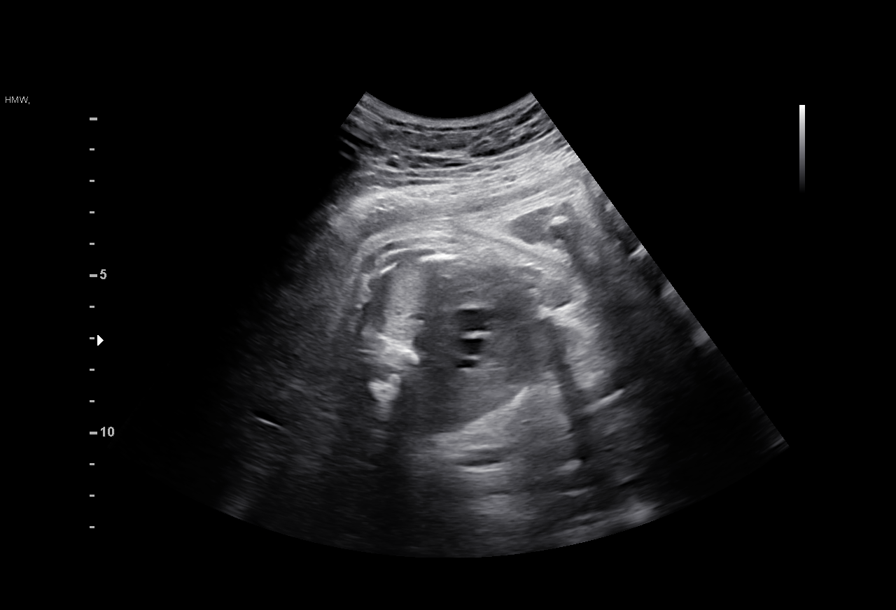
[im 24/27]
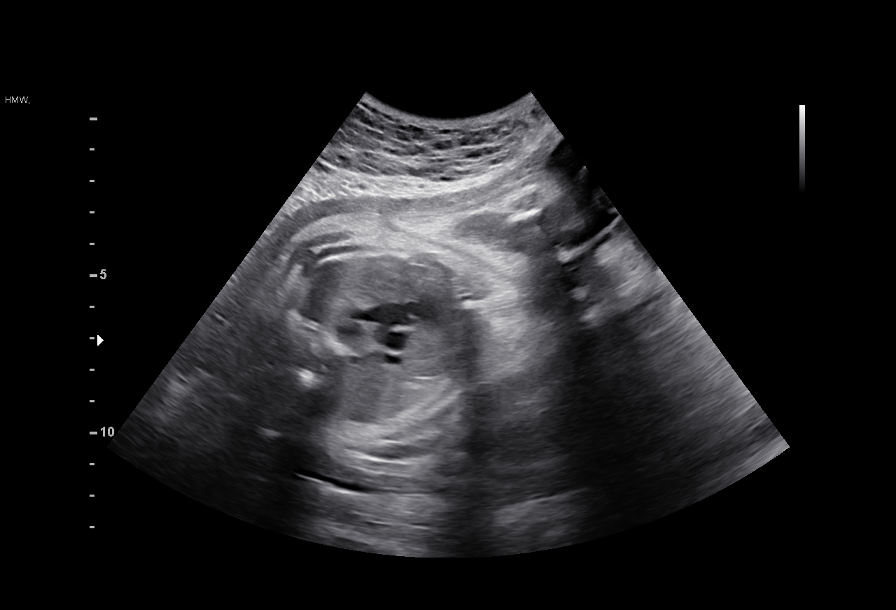
[im 26/27]
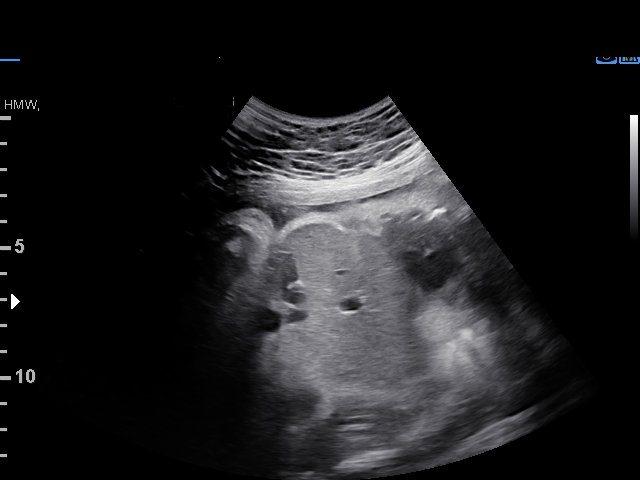

[12 of 27 positions shown; findings below may reference images not displayed]

----------------------------------------------------------------------

 ----------------------------------------------------------------------
Indications

  Cholestasis of pregnancy, third trimester      S11.1P8AV8.P
  High risk pregnancy (postural orthostatic
  tachycardia synd)
  33 weeks gestation of pregnancy
  Encounter for other antenatal screening
  follow-up
  Deep vein thrombosis (DVT)
 ----------------------------------------------------------------------
Vital Signs

                                                Height:        5'4"
Fetal Evaluation

 Num Of Fetuses:         1
 Fetal Heart Rate(bpm):  157
 Cardiac Activity:       Observed
 Presentation:           Cephalic

 Amniotic Fluid
 AFI FV:      Within normal limits

 AFI Sum(cm)     %Tile       Largest Pocket(cm)
 14              48

 RUQ(cm)       RLQ(cm)       LUQ(cm)        LLQ(cm)

Biophysical Evaluation

 Amniotic F.V:   Within normal limits       F. Tone:        Observed
 F. Movement:    Observed                   Score:          [DATE]
 F. Breathing:   Observed
OB History

 Gravidity:    1
Gestational Age

 LMP:           33w 6d        Date:  06/07/18                 EDD:   03/14/19
 Best:          33w 6d     Det. By:  LMP  (06/07/18)          EDD:   03/14/19
Anatomy

 Thoracic:              Appears normal         Bladder:                Appears normal
 Stomach:               Appears normal, left
                        sided
Cervix Uterus Adnexa

 Cervix
 Not visualized (advanced GA >23wks)
Comments

 This patient was seen for a biophysical profile due to a history
 of cholestasis of pregnancy currently treated with Actigall 500
 mg twice a day.  The patient reports that her itching has
 increased over the past week.  She continues to feel vigorous
 fetal movements throughout the day.
 A biophysical profile performed today was [DATE].
 There was normal amniotic fluid noted on today's ultrasound
 exam.
 Due to cholestasis of pregnancy, the patient reports that her
 delivery will be scheduled at between 36 to 37 weeks.
 She will return in 1 week for another biophysical profile.

## 2020-01-31 IMAGING — DX DG CHEST 1V PORT
1 series · 1 of 1 positions shown · non-contrast
Comparison: Portable exam 2312 hours compared to 03/21/2018

CLINICAL DATA: Chest pain

EXAM:
PORTABLE CHEST 1 VIEW

[chest]
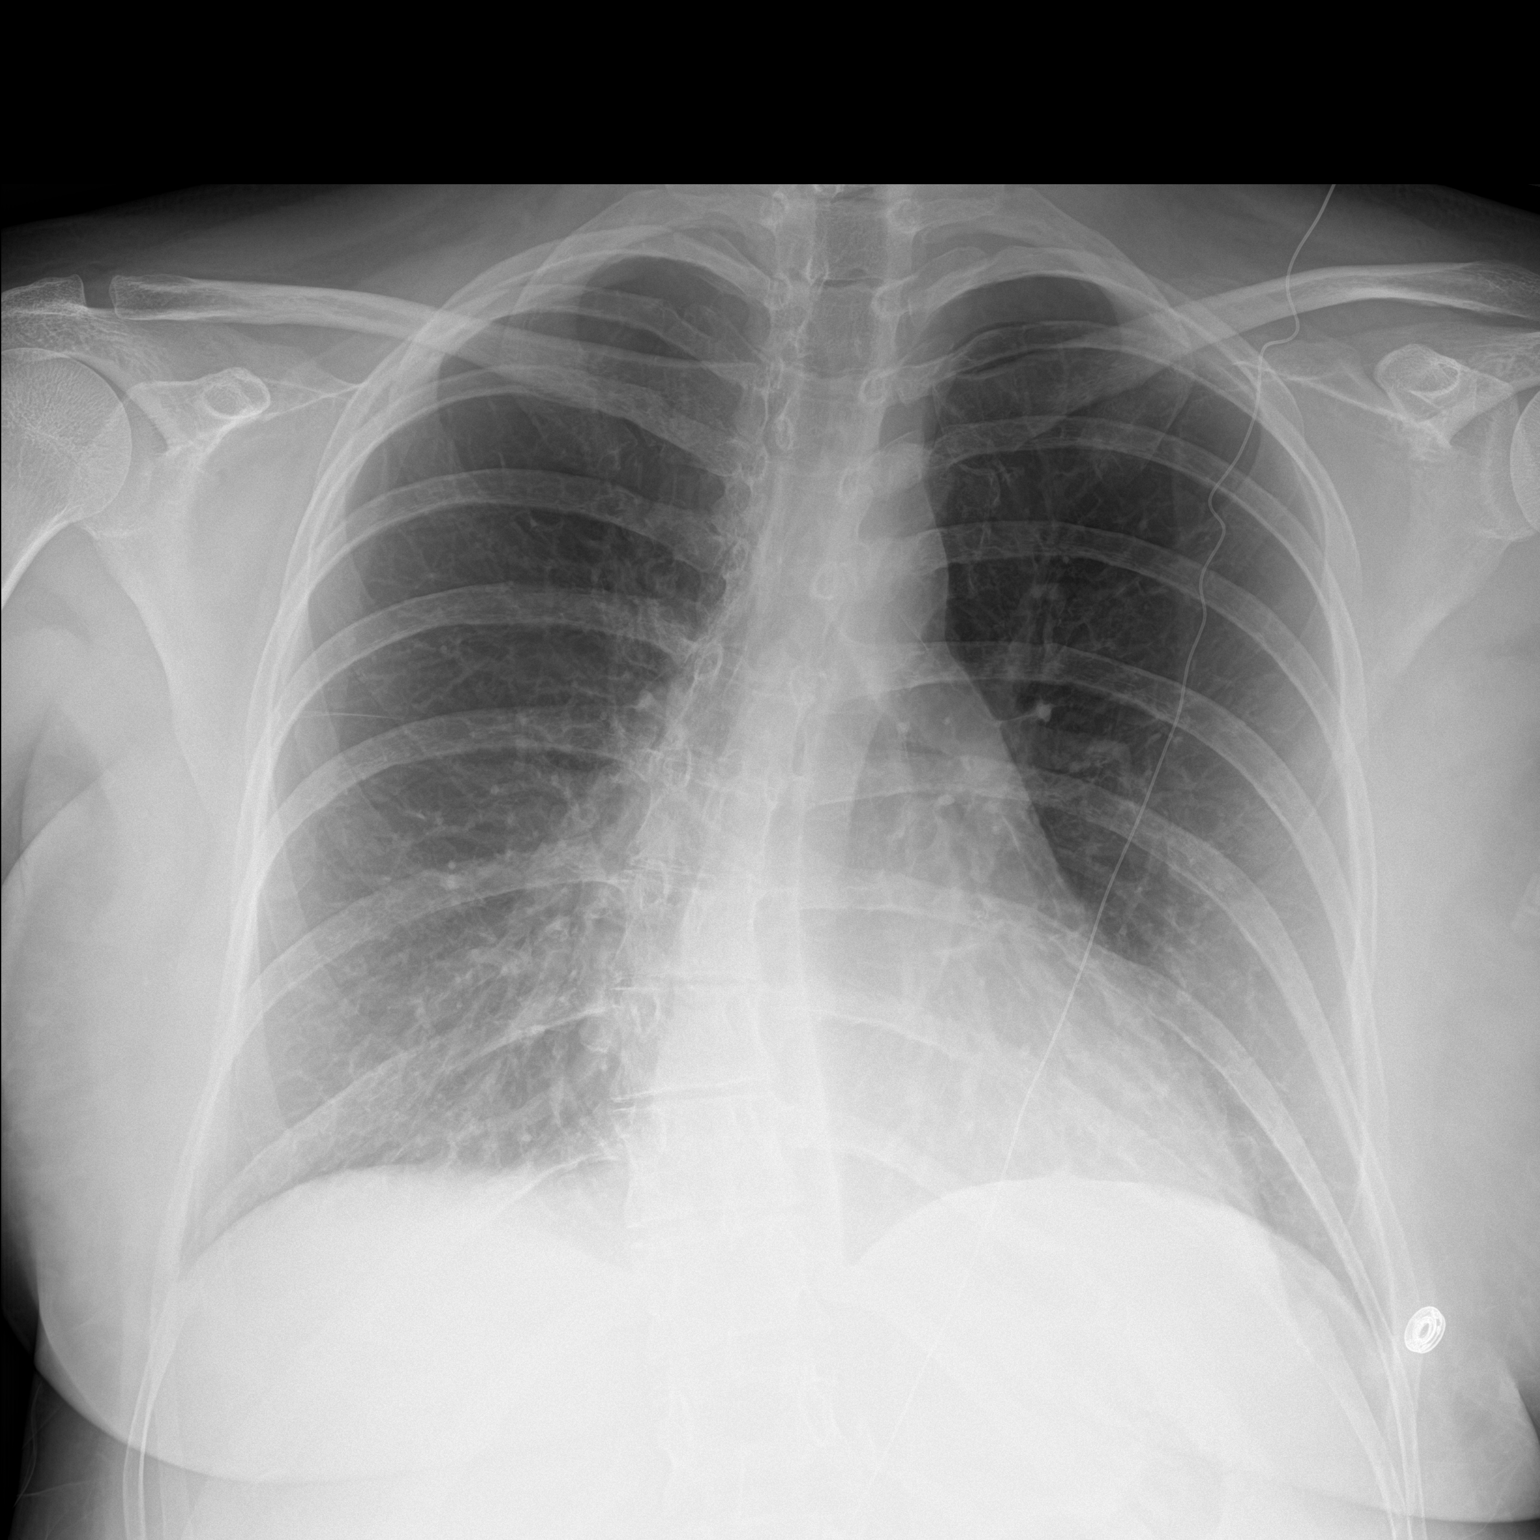

[1 of 1 positions shown; findings below may reference images not displayed]

FINDINGS: Normal heart size, mediastinal contours, and pulmonary vascularity.

Lungs clear.

No infiltrate, pleural effusion or pneumothorax.

Dextroconvex thoracic scoliosis.

Curvilinear wire traverses LEFT chest and abdomen.
IMPRESSION: No acute abnormalities.

Scoliosis.

## 2020-02-05 NOTE — Telephone Encounter (Signed)
Sabrina Riddle, and discussed her long episode of bleeding. She did not take the provera as prescribed. Her bleeding is most likely anovulatory bleeding where the lining has become thick.  I encouraged her to take the medications and that provera is safe with her medical conditions. Sabrina Riddle would like to research the information first.  I advised her to look at the cdc website on birth control for good information. Pt also contracted COVID and I advised her to still get vaccinated as well as encourage family members to do so.  Sabrina Riddle will send me a message if she decides to take the provera.

## 2020-11-02 ENCOUNTER — Other Ambulatory Visit: Payer: Self-pay

## 2020-11-02 ENCOUNTER — Other Ambulatory Visit: Payer: Medicaid Other | Admitting: *Deleted

## 2020-11-02 DIAGNOSIS — N970 Female infertility associated with anovulation: Secondary | ICD-10-CM

## 2020-11-02 NOTE — Progress Notes (Addendum)
Pt presented today for a D-3 [facility],Estradiol

## 2020-11-02 NOTE — Addendum Note (Signed)
Addended by: Granville Lewis on: 11/02/2020 03:07 PM   Modules accepted: Orders

## 2020-11-03 LAB — FOLLICLE STIMULATING HORMONE: FSH: 8.3 m[IU]/mL

## 2020-11-06 LAB — ESTRADIOL: Estradiol: 33 pg/mL

## 2020-11-06 LAB — ANTI-MULLERIAN HORMONE (AMH), FEMALE: Anti-Mullerian Hormones(AMH), Female: 7.45 ng/mL (ref 0.69–13.39)

## 2020-11-06 LAB — LUTEINIZING HORMONE: LH: 7.3 m[IU]/mL

## 2020-11-07 ENCOUNTER — Telehealth (INDEPENDENT_AMBULATORY_CARE_PROVIDER_SITE_OTHER): Payer: Medicaid Other | Admitting: Obstetrics & Gynecology

## 2020-11-07 ENCOUNTER — Other Ambulatory Visit: Payer: Self-pay

## 2020-11-07 ENCOUNTER — Encounter: Payer: Self-pay | Admitting: Obstetrics & Gynecology

## 2020-11-07 DIAGNOSIS — Z86718 Personal history of other venous thrombosis and embolism: Secondary | ICD-10-CM

## 2020-11-07 DIAGNOSIS — E282 Polycystic ovarian syndrome: Secondary | ICD-10-CM

## 2020-11-07 DIAGNOSIS — N979 Female infertility, unspecified: Secondary | ICD-10-CM | POA: Diagnosis not present

## 2020-11-07 MED ORDER — LETROZOLE 2.5 MG PO TABS: ORAL_TABLET | ORAL | 0 refills | Status: DC

## 2020-11-07 MED ORDER — PRENATAL VITAMINS 28-0.8 MG PO TABS: 1.0000 | ORAL_TABLET | Freq: Every day | ORAL | 12 refills | Status: DC

## 2020-11-07 NOTE — Progress Notes (Signed)
GYNECOLOGY VIRTUAL VISIT ENCOUNTER NOTE  Provider location: Center for [facility] [place]   Patient location: Home  I connected with Sabrina Riddle on 11/07/20 at 10:50 AM EDT by MyChart Video Encounter and verified that I am speaking with the correct person using two identifiers.   I discussed the limitations, risks, security and privacy concerns of performing an evaluation and management service virtually and the availability of in person appointments. I also discussed with the patient that there may be a patient responsible charge related to this service. The patient expressed understanding and agreed to proceed.   History:  Sabrina Riddle is a 25 y.o. G91P0101 female being evaluated today for infertility. She denies any abnormal vaginal discharge, bleeding, pelvic pain or other concerns.  Pt had normal Day 3 FSH/estradiol as well as AMH.  Pt used Clomid then Femara/metformin meds for several months then stopped.  Pt became pregnant the month she stopped.  The metformin caused significant GI complaints.  Pt has 32 day cycles and gets [facility] surge / feels like she is ovulating day 15/16.  She is unsure isf she is having intercourse before the surge or feelings.  They have been actively tying for about a year with regular menses occurring the past 5 months.  Husband had nml semen analysis prior to first child at Dr. Lenoria Farrier office.      Past Medical History:  Diagnosis Date   ADD (attention deficit disorder)    Arcuate uterus    Bicuspid aortic valve    Cholestasis of pregnancy    History of DVT (deep vein thrombosis)    POTS (postural orthostatic tachycardia syndrome)    Pulmonary embolism (HCC)    Vitamin B12 deficiency    Past Surgical History:  Procedure Laterality Date   TYMPANOSTOMY     WISDOM TOOTH EXTRACTION     WRIST SURGERY     The following portions of the patient's history were reviewed and updated as appropriate: allergies, current medications, past  family history, past medical history, past social history, past surgical history and problem list.   Health Maintenance:  Normal pap and negative HRHPV on 2020.   Review of Systems:  Pertinent items noted in HPI and remainder of comprehensive ROS otherwise negative.  Physical Exam:   General:  Alert, oriented and cooperative. Patient appears to be in no acute distress.  Mental Status: Normal mood and affect. Normal behavior. Normal judgment and thought content.   Respiratory: Normal respiratory effort, no problems with respiration noted  Rest of physical exam deferred due to type of encounter  Labs and Imaging Results for orders placed or performed in visit on 11/02/20 (from the past 336 hour(s))  LH   Collection Time: 11/02/20  3:19 PM  Result Value Ref Range   LH 7.3 mIU/mL  Estradiol   Collection Time: 11/02/20  3:19 PM  Result Value Ref Range   Estradiol 33 pg/mL  Anti-Mullerian Hormone [facility]), Female   Collection Time: 11/02/20  3:19 PM  Result Value Ref Range   Anti-Mullerian Hormones(AMH), Female 7.45 0.69 - 13.39 ng/mL  Follicle stimulating hormone   Collection Time: 11/02/20  3:20 PM  Result Value Ref Range   FSH 8.3 mIU/mL   No results found.     Assessment and Plan:     Female infertility / PCOS  It is encouraging the patient is now having regular menstrual cycles.  We discussed the options of Clomid, Femara, metformin.  Joint decision-making and patient  will try Femara 2.5 mg days 3 through 7 of her cycle.  If she does not become pregnant she will increase to 5 mg.  We will not repeat the semen analysis at this time. Prenatal vitamins daily Timed intercourse to start 4 days prior to ovulation. Will start Lovenox with pregnancy (Hx of DVT while on OCPs)      I discussed the assessment and treatment plan with the patient. The patient was provided an opportunity to ask questions and all were answered. The patient agreed with the plan and demonstrated an  understanding of the instructions.   The patient was advised to call back or seek an in-person evaluation/go to the ED if the symptoms worsen or if the condition fails to improve as anticipated.  I provided 18 minutes of face-to-face time during this encounter with 30 minutes total in review of record and documentation.    Elsie Lincoln, MD Center for [employer], [facility], [facility]

## 2020-11-21 ENCOUNTER — Other Ambulatory Visit: Payer: Self-pay | Admitting: *Deleted

## 2020-11-21 MED ORDER — PRENATAL VITAMIN/MIN +DHA 27-0.8-200 MG PO CAPS: 1.0000 | ORAL_CAPSULE | Freq: Every day | ORAL | 12 refills | Status: AC

## 2021-02-03 ENCOUNTER — Other Ambulatory Visit: Payer: Medicaid Other

## 2021-02-03 ENCOUNTER — Other Ambulatory Visit: Payer: Self-pay

## 2021-02-03 DIAGNOSIS — Z3200 Encounter for pregnancy test, result unknown: Secondary | ICD-10-CM

## 2021-02-03 LAB — HCG, QUANTITATIVE, PREGNANCY: HCG, Total, QN: 707 m[IU]/mL

## 2021-02-03 NOTE — Progress Notes (Signed)
Pt is insistent upon getting a BHCG drawn.  She recently had a positive UPT.  She is scheduled for a NOB next month but is very anxious.  LMP 12/30/20

## 2021-02-04 ENCOUNTER — Encounter: Payer: Self-pay | Admitting: Obstetrics & Gynecology

## 2021-03-06 ENCOUNTER — Encounter: Payer: Self-pay | Admitting: *Deleted

## 2021-03-10 ENCOUNTER — Ambulatory Visit (INDEPENDENT_AMBULATORY_CARE_PROVIDER_SITE_OTHER): Payer: Medicaid Other | Admitting: Certified Nurse Midwife

## 2021-03-10 ENCOUNTER — Other Ambulatory Visit: Payer: Self-pay

## 2021-03-10 ENCOUNTER — Other Ambulatory Visit (HOSPITAL_COMMUNITY)
Admission: RE | Admit: 2021-03-10 | Discharge: 2021-03-10 | Disposition: A | Discharge status: Home / Self Care | Payer: Medicaid Other | Source: Ambulatory Visit | Attending: Certified Nurse Midwife | Admitting: Certified Nurse Midwife

## 2021-03-10 ENCOUNTER — Encounter: Payer: Self-pay | Admitting: Certified Nurse Midwife

## 2021-03-10 VITALS — BP 106/65 | HR 76 | Wt 143.0 lb

## 2021-03-10 DIAGNOSIS — Z8751 Personal history of pre-term labor: Secondary | ICD-10-CM | POA: Diagnosis not present

## 2021-03-10 DIAGNOSIS — Z8719 Personal history of other diseases of the digestive system: Secondary | ICD-10-CM

## 2021-03-10 DIAGNOSIS — Z3480 Encounter for supervision of other normal pregnancy, unspecified trimester: Secondary | ICD-10-CM | POA: Insufficient documentation

## 2021-03-10 DIAGNOSIS — Z3A10 10 weeks gestation of pregnancy: Secondary | ICD-10-CM

## 2021-03-10 DIAGNOSIS — Z8759 Personal history of other complications of pregnancy, childbirth and the puerperium: Secondary | ICD-10-CM | POA: Diagnosis not present

## 2021-03-10 DIAGNOSIS — Z86718 Personal history of other venous thrombosis and embolism: Secondary | ICD-10-CM

## 2021-03-10 NOTE — Progress Notes (Signed)
Subjective:  Shevonda Rideau is a 26 y.o. G2P0101 at [redacted]w[redacted]d being seen today for her first prenatal visit  She is currently monitored for the following issues for this high-risk pregnancy and has ADD (attention deficit disorder); History of DVT (deep vein thrombosis); Postural orthostatic tachycardia syndrome; Tachycardia; Anemia affecting pregnancy in third trimester; Blood type, Rh negative; History of cholestasis during pregnancy; Supervision of other normal pregnancy, antepartum; and History of preterm delivery on their problem list.  Patient reports nausea and vomiting.  Contractions: Not present. Vag. Bleeding: None.  Movement: Absent. Denies leaking of fluid.   The following portions of the patient's history were reviewed and updated as appropriate: allergies, current medications, past family history, past medical history, past social history, past surgical history and problem list. Problem list updated.  Objective:   Vitals:   03/10/21 0943  BP: 106/65  Pulse: 76  Weight: 143 lb (64.9 kg)    Fetal Status: Fetal Heart Rate (bpm): 167lb   Movement: Absent     General:  Alert, oriented and cooperative. Patient is in no acute distress.  Skin: Skin is warm and dry. No rash noted.   Cardiovascular: Normal heart rate noted  Respiratory: Normal respiratory effort, no problems with respiration noted  Abdomen: Soft, gravid, appropriate for gestational age. Pain/Pressure: Absent     Pelvic: Vag. Bleeding: None Vag D/C Character: Thin   Cervical exam deferred        Extremities: Normal range of motion.  Edema: None  Mental Status: Normal mood and affect. Normal behavior. Normal judgment and thought content.   Urinalysis:      Assessment and Plan:  Pregnancy: G2P0101 at [redacted]w[redacted]d  1. Supervision of other normal pregnancy, antepartum  - Obstetric panel - HIV antibody (with reflex) - Hepatitis C Antibody - Culture, OB Urine - GC/Chlamydia probe amp ([place])not at [facility] -  Babyscripts Schedule Optimization  2. History of cholestasis during pregnancy -discussed sx, when to notify  3. [redacted] weeks gestation of pregnancy -confirmed by [place] today  4. History of preterm delivery -IOL for ICP  5. History of DVT (deep vein thrombosis) -provoked by OCPs when she was 18 -on Lovenox last pregnancy -resistant to starting Lovenox this pregnancy, wants to use only natural supplements, is cautious about taking anything from "big pharma" -discussed increased DVT risk in pregnancy alone with added risk since she has dvt hx, unaware of any effective natural treatment for DVT prevention -discussed risks of DVT including poor outcomes, PE, or death -will consult with Dr. Gala Romney for any other recommendations  Preterm labor symptoms and general obstetric precautions including but not limited to vaginal bleeding, contractions, leaking of fluid and fetal movement were reviewed in detail with the patient. Please refer to After Visit Summary for other counseling recommendations.  Return in about 6 weeks (around 04/21/2021).   Julianne Handler, CNM

## 2021-03-10 NOTE — Patient Instructions (Signed)

## 2021-03-10 NOTE — Progress Notes (Signed)
Bedside U/S shows single IUP with FHT of 167 BPM and CRL measuring 28.31mm  GA [redacted]w[redacted]d

## 2021-03-12 LAB — URINE CULTURE, OB REFLEX

## 2021-03-12 LAB — CULTURE, OB URINE

## 2021-03-13 LAB — GC/CHLAMYDIA PROBE AMP (~~LOC~~) NOT AT ARMC
Chlamydia: NEGATIVE
Comment: NEGATIVE
Comment: NORMAL
Neisseria Gonorrhea: NEGATIVE

## 2021-04-03 ENCOUNTER — Other Ambulatory Visit: Payer: Self-pay | Admitting: *Deleted

## 2021-04-03 DIAGNOSIS — Z86718 Personal history of other venous thrombosis and embolism: Secondary | ICD-10-CM

## 2021-04-12 ENCOUNTER — Other Ambulatory Visit: Payer: Self-pay | Admitting: *Deleted

## 2021-04-12 DIAGNOSIS — Z3480 Encounter for supervision of other normal pregnancy, unspecified trimester: Secondary | ICD-10-CM

## 2021-04-12 DIAGNOSIS — Z86718 Personal history of other venous thrombosis and embolism: Secondary | ICD-10-CM

## 2021-04-21 ENCOUNTER — Telehealth: Payer: Self-pay | Admitting: *Deleted

## 2021-04-21 ENCOUNTER — Telehealth (INDEPENDENT_AMBULATORY_CARE_PROVIDER_SITE_OTHER): Payer: Medicaid Other | Admitting: Obstetrics and Gynecology

## 2021-04-21 VITALS — BP 112/73 | HR 80 | Wt 145.0 lb

## 2021-04-21 DIAGNOSIS — Z86718 Personal history of other venous thrombosis and embolism: Secondary | ICD-10-CM

## 2021-04-21 DIAGNOSIS — Z8751 Personal history of pre-term labor: Secondary | ICD-10-CM | POA: Diagnosis not present

## 2021-04-21 DIAGNOSIS — O09292 Supervision of pregnancy with other poor reproductive or obstetric history, second trimester: Secondary | ICD-10-CM

## 2021-04-21 DIAGNOSIS — O360120 Maternal care for anti-D [Rh] antibodies, second trimester, not applicable or unspecified: Secondary | ICD-10-CM

## 2021-04-21 DIAGNOSIS — O09212 Supervision of pregnancy with history of pre-term labor, second trimester: Secondary | ICD-10-CM

## 2021-04-21 DIAGNOSIS — Z3480 Encounter for supervision of other normal pregnancy, unspecified trimester: Secondary | ICD-10-CM

## 2021-04-21 DIAGNOSIS — Z3A16 16 weeks gestation of pregnancy: Secondary | ICD-10-CM

## 2021-04-21 DIAGNOSIS — Z6791 Unspecified blood type, Rh negative: Secondary | ICD-10-CM

## 2021-04-21 DIAGNOSIS — Z8719 Personal history of other diseases of the digestive system: Secondary | ICD-10-CM

## 2021-04-21 NOTE — Telephone Encounter (Signed)
Left patient an urgent message that ROB appointment has been scheduled for Thursday, 05/18/2021 with Dr. Para March for MD only appointment per Venia Carbon at 10:30 AM. If patient cannot keep appointment day and or time to please call or MyChart message to reschedule. ?

## 2021-04-21 NOTE — Progress Notes (Signed)
? ? ?TELEHEALTH OBSTETRICS VISIT ENCOUNTER NOTE ? ?Provider location: Center for [facility] at [place]  ? ?Patient location: Home ? ?I connected with Rumalda Riddle on 04/23/21 at  9:30 AM EDT by telephone at home and verified that I am speaking with the correct person using two identifiers. Of note, unable to do video encounter due to technical difficulties.  ?  ?I discussed the limitations, risks, security and privacy concerns of performing an evaluation and management service by telephone and the availability of in person appointments. I also discussed with the patient that there may be a patient responsible charge related to this service. The patient expressed understanding and agreed to proceed. ? ?Subjective:  ?Sabrina Riddle is a 26 y.o. G2P0101 at [redacted]w[redacted]d being followed for ongoing prenatal care.  She is currently monitored for the following issues for this high-risk pregnancy and has ADD (attention deficit disorder); History of DVT (deep vein thrombosis); Postural orthostatic tachycardia syndrome; Tachycardia; Anemia affecting pregnancy in third trimester; Blood type, Rh negative; History of cholestasis during pregnancy; Supervision of other normal pregnancy, antepartum; and History of preterm delivery on their problem list. ? ?Patient reports no complaints. Reports fetal movement. Denies any contractions, bleeding or leaking of fluid.  ? ?The following portions of the patient's history were reviewed and updated as appropriate: allergies, current medications, past family history, past medical history, past social history, past surgical history and problem list.  ? ?Objective:  ?Blood pressure 112/73, pulse 80, weight 145 lb (65.8 kg), last menstrual period 12/30/2020. ?General:  Alert, oriented and cooperative.   ?Mental Status: Normal mood and affect perceived. Normal judgment and thought content.  ?Rest of physical exam deferred due to type of encounter ? ?Assessment and Plan:   ?Pregnancy: G2P0101 at [redacted]w[redacted]d ? ?1. History of cholestasis during pregnancy ? ?Aware of signs and symptoms.  ? ?2. Supervision of other normal pregnancy, antepartum ? ?Declined NIPS or genetic screening.  ?Had a terrible reactive to the 2 hour GTT test. We discussed doing alternative testing with finger sticks/charting BS readings for 2 weeks.  ? ?3. History of preterm delivery ? ?36w 2/2 cholestasis  ? ? ?4. History of DVT (deep vein thrombosis) ? ?Declined prophylaxis  ?Rheumatologist said she is not at higher risk for blood clots given the cause of blood clot (OCP's) ? ?5. Blood type, Rh negative ? ?Still doing research about rhogam. Daughter was RH positive and she did do the regimen of rhogam intrapartum and postpartum. RhoGAM prevents hemolytic disease of the fetus and newborn; this was discussed in detail with the patient.  ? ? ?Preterm labor symptoms and general obstetric precautions including but not limited to vaginal bleeding, contractions, leaking of fluid and fetal movement were reviewed in detail with the patient.  ?I discussed the assessment and treatment plan with the patient. The patient was provided an opportunity to ask questions and all were answered. The patient agreed with the plan and demonstrated an understanding of the instructions. The patient was advised to call back or seek an in-person office evaluation/go to MAU at [facility] for any urgent or concerning symptoms. ?Please refer to After Visit Summary for other counseling recommendations.  ? ?I provided 15 minutes of non-face-to-face time during this encounter. ? ?Return in about 4 weeks (around 05/19/2021), or MD only,hx of DVT, preterm delivery. ? ?Future Appointments  ?Date Time Provider [place]  ?05/12/2021 10:30 AM [facility] NURSE [facility]  ?05/12/2021 10:45 AM [facility] US5 [facility]  ?  05/12/2021 12:00 PM [facility] MD RM [facility]  ?05/18/2021 10:30 AM Radene Gunning, MD [place] [facility]  ? ? ?Noni Saupe, NP ?Center for [place] ? ?  ?

## 2021-05-12 ENCOUNTER — Ambulatory Visit: Payer: Medicaid Other | Admitting: *Deleted

## 2021-05-12 ENCOUNTER — Encounter: Payer: Self-pay | Admitting: *Deleted

## 2021-05-12 ENCOUNTER — Ambulatory Visit (HOSPITAL_BASED_OUTPATIENT_CLINIC_OR_DEPARTMENT_OTHER): Payer: Medicaid Other | Admitting: Obstetrics

## 2021-05-12 ENCOUNTER — Other Ambulatory Visit: Payer: Self-pay | Admitting: *Deleted

## 2021-05-12 ENCOUNTER — Ambulatory Visit: Payer: Medicaid Other | Attending: Certified Nurse Midwife

## 2021-05-12 VITALS — BP 115/55 | HR 93

## 2021-05-12 DIAGNOSIS — Z3480 Encounter for supervision of other normal pregnancy, unspecified trimester: Secondary | ICD-10-CM

## 2021-05-12 DIAGNOSIS — O2292 Venous complication in pregnancy, unspecified, second trimester: Secondary | ICD-10-CM | POA: Diagnosis not present

## 2021-05-12 DIAGNOSIS — Z8759 Personal history of other complications of pregnancy, childbirth and the puerperium: Secondary | ICD-10-CM | POA: Insufficient documentation

## 2021-05-12 DIAGNOSIS — O09292 Supervision of pregnancy with other poor reproductive or obstetric history, second trimester: Secondary | ICD-10-CM | POA: Diagnosis present

## 2021-05-12 DIAGNOSIS — Z3A19 19 weeks gestation of pregnancy: Secondary | ICD-10-CM | POA: Diagnosis not present

## 2021-05-12 DIAGNOSIS — Z8751 Personal history of pre-term labor: Secondary | ICD-10-CM

## 2021-05-12 DIAGNOSIS — O09212 Supervision of pregnancy with history of pre-term labor, second trimester: Secondary | ICD-10-CM | POA: Diagnosis not present

## 2021-05-12 DIAGNOSIS — Z8719 Personal history of other diseases of the digestive system: Secondary | ICD-10-CM

## 2021-05-12 DIAGNOSIS — Z86718 Personal history of other venous thrombosis and embolism: Secondary | ICD-10-CM

## 2021-05-12 DIAGNOSIS — Z363 Encounter for antenatal screening for malformations: Secondary | ICD-10-CM | POA: Diagnosis not present

## 2021-05-12 DIAGNOSIS — O09892 Supervision of other high risk pregnancies, second trimester: Secondary | ICD-10-CM

## 2021-05-12 DIAGNOSIS — Z87898 Personal history of other specified conditions: Secondary | ICD-10-CM

## 2021-05-12 NOTE — Progress Notes (Signed)
MFM Note ? ?Sabrina Riddle was seen for a detailed fetal anatomy scan and consultation due to a history of a prior pulmonary embolus while taking oral contraceptives 8 years ago.  She was treated with prophylactic Lovenox during her last pregnancy.  She reports that she underwent a thrombophilia work-up which was all negative.  Her prior pregnancy was also complicated by cholestasis of pregnancy. ? ?She denies any other significant past medical history and denies any problems in her current pregnancy.  ?  ?She has declined all screening tests for fetal aneuploidy in her current pregnancy. ? ?She was informed that the fetal growth and amniotic fluid level were appropriate for her gestational age.  ? ?There were no obvious fetal anomalies noted on today's ultrasound exam. ? ?The patient was informed that anomalies may be missed due to technical limitations. If the fetus is in a suboptimal position or maternal habitus is increased, visualization of the fetus in the maternal uterus may be impaired. ? ?The following were discussed during today's consultation: ? ?History of pulmonary embolus and pregnancy ? ?The patient was advised that as pregnancy is a hypercoagulable state (due to the estrogen and progesterone produced by the placenta), the fact that she developed a pulmonary embolus while taking oral contraceptives, probably means that she is at increased risk of another thromboembolic event during pregnancy. ? ?She was advised that due to her history of a pulmonary embolus, my recommendation would be for her to be placed on prophylactic Lovenox (40 mg daily) for the duration of her pregnancy and for 6 weeks postpartum. ? ?The patient has declined prophylactic anticoagulation in her current pregnancy.  After what was discussed today, the patient stated that she will think about whether or not she wants to be placed on Lovenox during her pregnancy. ? ?If she does not want prophylactic anticoagulation during pregnancy, I  would recommend that she at least take a daily baby aspirin (81 mg daily) for prophylaxis.   ? ?As the postpartum period is associated with the highest risk for thrombosis, if the patient does not receive prophylaxis during her pregnancy, she should receive prophylactic Lovenox (40 mg daily) in the postpartum period for 6 weeks after delivery. ? ?The patient was reassured that Lovenox or subcu heparin will not cross the placenta and will not affect the fetus. ? ?History of cholestasis and a prior pregnancy ? ?The patient was advised to continue to monitor for signs and symptoms of cholestasis of pregnancy. ? ?As the patient would prefer to have the minimal amount of ultrasounds performed in her pregnancy, a follow-up growth scan was scheduled in the third trimester at around 32 weeks.   ? ?She should be referred back to our office before 32 weeks should she develop cholestasis of pregnancy again. ? ?The patient stated that all of her questions were answered today. ? ?A total of 30 minutes was spent counseling and coordinating the care for this patient.  Greater than 50% of the time was spent in direct face-to-face contact. ?

## 2021-05-15 NOTE — Progress Notes (Deleted)
   PRENATAL VISIT NOTE  Subjective:  Sabrina Riddle is a 26 y.o. G2P0101 at [redacted]w[redacted]d being seen today for ongoing prenatal care.  She is currently monitored for the following issues for this low-risk pregnancy and has ADD (attention deficit disorder); History of DVT (deep vein thrombosis); Postural orthostatic tachycardia syndrome; Tachycardia; Blood type, Rh negative; History of cholestasis during pregnancy; Supervision of other normal pregnancy, antepartum; and History of preterm delivery on their problem list.  Patient reports {[id number]}.   .  .   . Denies leaking of fluid.   The following portions of the patient's history were reviewed and updated as appropriate: allergies, current medications, past family history, past medical history, past social history, past surgical history and problem list.   Objective:  There were no vitals filed for this visit.  Fetal Status:           General:  Alert, oriented and cooperative. Patient is in no acute distress.  Skin: Skin is warm and dry. No rash noted.   Cardiovascular: Normal heart rate noted  Respiratory: Normal respiratory effort, no problems with respiration noted  Abdomen: Soft, gravid, appropriate for gestational age.        Pelvic: Cervical exam deferred        Extremities: Normal range of motion.     Mental Status: Normal mood and affect. Normal behavior. Normal judgment and thought content.   Assessment and Plan:  Pregnancy: G2P0101 at [redacted]w[redacted]d 1. History of DVT (deep vein thrombosis) - actual history was PE Clarified OCPs comparable with pregnancy (in fact, pregnancy is higher risk). I disagree with perspective of rheumatologist. I would recommend prophylaxis. MFM also recommended prophylaxis during her pregnancy. MFM also recommended 6 wks postpartum (agreed) and ldASA if she declines prophylaxis.   2. History of cholestasis during pregnancy - MFM recommended growth at 32w.   3. Supervision of other normal pregnancy,  antepartum ***  4. History of preterm delivery At 36w due to cholestasis  5. Blood type, Rh negative - Recommend rhogam at 28w  Preterm labor symptoms and general obstetric precautions including but not limited to vaginal bleeding, contractions, leaking of fluid and fetal movement were reviewed in detail with the patient. Please refer to After Visit Summary for other counseling recommendations.   No follow-ups on file.  Future Appointments  Date Time Provider [place]  05/18/2021 10:30 AM Radene Gunning, MD [facility]  08/11/2021  9:15 AM [facility] NURSE [facility]  08/11/2021  9:30 AM [facility] US3 [facility]    Radene Gunning, MD

## 2021-05-18 ENCOUNTER — Encounter: Payer: Medicaid Other | Admitting: Obstetrics and Gynecology

## 2021-05-18 DIAGNOSIS — Z3480 Encounter for supervision of other normal pregnancy, unspecified trimester: Secondary | ICD-10-CM

## 2021-05-18 DIAGNOSIS — Z8719 Personal history of other diseases of the digestive system: Secondary | ICD-10-CM

## 2021-05-18 DIAGNOSIS — Z6791 Unspecified blood type, Rh negative: Secondary | ICD-10-CM

## 2021-05-18 DIAGNOSIS — Z86718 Personal history of other venous thrombosis and embolism: Secondary | ICD-10-CM

## 2021-05-18 DIAGNOSIS — Z8751 Personal history of pre-term labor: Secondary | ICD-10-CM

## 2021-08-11 ENCOUNTER — Ambulatory Visit: Payer: Medicaid Other | Attending: Obstetrics

## 2021-08-11 ENCOUNTER — Ambulatory Visit: Payer: Medicaid Other

## 2022-06-23 ENCOUNTER — Inpatient Hospital Stay (HOSPITAL_COMMUNITY)
Admission: AD | Admit: 2022-06-23 | Discharge: 2022-06-23 | Disposition: A | Discharge status: Home / Self Care | Payer: Medicaid Other | Attending: Obstetrics & Gynecology | Admitting: Obstetrics & Gynecology

## 2022-06-23 ENCOUNTER — Encounter (HOSPITAL_COMMUNITY): Payer: Self-pay | Admitting: Obstetrics & Gynecology

## 2022-06-23 ENCOUNTER — Inpatient Hospital Stay (HOSPITAL_COMMUNITY): Payer: Medicaid Other

## 2022-06-23 DIAGNOSIS — Z86718 Personal history of other venous thrombosis and embolism: Secondary | ICD-10-CM | POA: Diagnosis not present

## 2022-06-23 DIAGNOSIS — Z880 Allergy status to penicillin: Secondary | ICD-10-CM | POA: Diagnosis not present

## 2022-06-23 DIAGNOSIS — O26891 Other specified pregnancy related conditions, first trimester: Secondary | ICD-10-CM | POA: Insufficient documentation

## 2022-06-23 DIAGNOSIS — Z3491 Encounter for supervision of normal pregnancy, unspecified, first trimester: Secondary | ICD-10-CM

## 2022-06-23 DIAGNOSIS — R109 Unspecified abdominal pain: Secondary | ICD-10-CM

## 2022-06-23 DIAGNOSIS — Z3A01 Less than 8 weeks gestation of pregnancy: Secondary | ICD-10-CM | POA: Diagnosis not present

## 2022-06-23 LAB — POCT PREGNANCY, URINE: Preg Test, Ur: POSITIVE — AB

## 2022-06-23 LAB — CBC
HCT: 36.2 % (ref 36.0–46.0)
Hemoglobin: 12.1 g/dL (ref 12.0–15.0)
MCH: 31.2 pg (ref 26.0–34.0)
MCHC: 33.4 g/dL (ref 30.0–36.0)
MCV: 93.3 fL (ref 80.0–100.0)
Platelets: 352 10*3/uL (ref 150–400)
RBC: 3.88 MIL/uL (ref 3.87–5.11)
RDW: 14.6 % (ref 11.5–15.5)
WBC: 6.3 10*3/uL (ref 4.0–10.5)
nRBC: 0 % (ref 0.0–0.2)

## 2022-06-23 LAB — HCG, QUANTITATIVE, PREGNANCY: hCG, Beta Chain, Quant, S: 161685 m[IU]/mL — ABNORMAL HIGH (ref <5)

## 2022-06-23 NOTE — MAU Note (Signed)
.  Sabrina Riddle is a 27 y.o. at Unknown here in MAU reporting: she wen tto a "botique U/s place" and was told they saw 2 sacs. One with a HR 50 and one with no HR. It was and external U/S. Here to make sure everything is OK. Denies any Pain, cramping or bleeding. LMP: 04/22/22 Onset of complaint: today Pain score: 0 Vitals:   06/23/22 1504  BP: 108/63  Pulse: 98  Resp: 18  Temp: 98.1 F (36.7 C)     FHT:n/a Lab orders placed from triage:  UPT

## 2022-06-23 NOTE — MAU Provider Note (Signed)
Chief Complaint: Possible Pregnancy   Event Date/Time   First Provider Initiated Contact with Patient 06/23/22 1527      SUBJECTIVE HPI: Sabrina Riddle is a 27 y.o. [id number] at [redacted]w[redacted]d by LMP who presents to maternity admissions reporting she had an ultrasound at a private commercial location today that showed 2 gestational sacs but only one fetal pole with FHR. She reports some mild menstrual like cramping, similar to what she had in prior pregnancies.  There is no bleeding.  She is sure of LMP but does have irregular menstrual cycles due to breastfeeding her 28 month old.   HPI  Past Medical History:  Diagnosis Date   ADD (attention deficit disorder)    Arcuate uterus    Bicuspid aortic valve    Cholestasis of pregnancy    History of DVT (deep vein thrombosis)    POTS (postural orthostatic tachycardia syndrome)    Pulmonary embolism (HCC)    Vitamin B12 deficiency    Past Surgical History:  Procedure Laterality Date   TYMPANOSTOMY     WISDOM TOOTH EXTRACTION     WRIST SURGERY     Social History   Socioeconomic History   Marital status: Married    Spouse name: Not on file   Number of children: Not on file   Years of education: Not on file   Highest education level: Not on file  Occupational History   Not on file  Tobacco Use   Smoking status: Never   Smokeless tobacco: Never  Vaping Use   Vaping Use: Never used  Substance and Sexual Activity   Alcohol use: No   Drug use: No   Sexual activity: Yes    Birth control/protection: None  Other Topics Concern   Not on file  Social History Narrative   Not on file   Social Determinants of Health   Financial Resource Strain: Not on file  Food Insecurity: Not on file  Transportation Needs: Not on file  Physical Activity: Not on file  Stress: Not on file  Social Connections: Not on file  Intimate Partner Violence: Not on file   No current facility-administered medications on file prior to encounter.   Current  Outpatient Medications on File Prior to Encounter  Medication Sig Dispense Refill   Ascorbic Acid (VITAMIN C) 100 MG tablet Take 100 mg by mouth daily.     milk thistle 175 MG tablet Take 175 mg by mouth daily.     Prenatal Vit-Fe Sulfate-FA-DHA (PRENATAL VITAMIN/MIN +DHA) 27-0.8-200 MG CAPS Take 1 tablet by mouth daily. 30 capsule 12   Allergies  Allergen Reactions   Methylphenidate Itching   Penicillins Anaphylaxis    Did it involve swelling of the face/tongue/throat, SOB, or low BP? Yes Did it involve sudden or severe rash/hives, skin peeling, or any reaction on the inside of your mouth or nose? Yes Did you need to seek medical attention at a hospital or doctor's office? Yes When did it last happen?  In high school     If all above answers are "NO", may proceed with cephalosporin use.    Morphine Nausea And Vomiting   Concerta [Methylphenidate Hcl] Itching   Dexmethylphenidate Itching   Hydrocodone Nausea And Vomiting   Lisdexamfetamine Palpitations    Other reaction(s): Delusions (intolerance)   Vyvanse [Lisdexamfetamine Dimesylate] Palpitations    ROS:  Review of Systems  Constitutional:  Negative for chills, fatigue and fever.  Respiratory:  Negative for shortness of breath.   Cardiovascular:  Negative  for chest pain.  Gastrointestinal:  Positive for abdominal pain. Negative for nausea and vomiting.  Genitourinary:  Negative for difficulty urinating, dysuria, flank pain, pelvic pain, vaginal bleeding, vaginal discharge and vaginal pain.  Neurological:  Negative for dizziness and headaches.  Psychiatric/Behavioral: Negative.       I have reviewed patient's Past Medical Hx, Surgical Hx, Family Hx, Social Hx, medications and allergies.   Physical Exam  Patient Vitals for the past 24 hrs:  BP Temp Pulse Resp Height Weight  06/23/22 1504 108/63 98.1 F (36.7 C) 98 18 5\' 4"  (1.626 m) 59 kg   Constitutional: Well-developed, well-nourished female in no acute distress.   Cardiovascular: normal rate Respiratory: normal effort GI: Abd soft, non-tender. Pos BS x 4 MS: Extremities nontender, no edema, normal ROM Neurologic: Alert and oriented x 4.  GU: Neg CVAT.  PELVIC EXAM: self swab    LAB RESULTS Results for orders placed or performed during the hospital encounter of 06/23/22 (from the past 24 hour(s))  Pregnancy, urine POC     Status: Abnormal   Collection Time: 06/23/22  3:17 PM  Result Value Ref Range   Preg Test, Ur POSITIVE (A) NEGATIVE  CBC     Status: None   Collection Time: 06/23/22  4:01 PM  Result Value Ref Range   WBC 6.3 4.0 - 10.5 K/uL   RBC 3.88 3.87 - 5.11 MIL/uL   Hemoglobin 12.1 12.0 - 15.0 g/dL   HCT 16.1 09.6 - 04.5 %   MCV 93.3 80.0 - 100.0 fL   MCH 31.2 26.0 - 34.0 pg   MCHC 33.4 30.0 - 36.0 g/dL   RDW 40.9 81.1 - 91.4 %   Platelets 352 150 - 400 K/uL   nRBC 0.0 0.0 - 0.2 %  hCG, quantitative, pregnancy     Status: Abnormal   Collection Time: 06/23/22  4:01 PM  Result Value Ref Range   hCG, Beta Chain, Quant, S 161,685 (H) <5 mIU/mL       IMAGING US OB Comp Less 14 Wks  Result Date: 06/23/2022 CLINICAL DATA:  Pain EXAM: OBSTETRIC <14 WK [place] AND TRANSVAGINAL OB US TECHNIQUE: Both transabdominal and transvaginal ultrasound examinations were performed for complete evaluation of the gestation as well as the maternal uterus, adnexal regions, and pelvic cul-de-sac. Transvaginal technique was performed to assess early pregnancy. COMPARISON:  None Available. FINDINGS: Intrauterine gestational sac: Single Yolk sac:  Visualized. Embryo:  Visualized. Cardiac Activity: Visualized. Heart Rate: 154 bpm CRL:  13.3 mm   7 w   4 d                  [place] EDC: 02/05/2023 Subchorionic hemorrhage: Small subchorionic hemorrhage measuring 2.0 x 1.5 x 1.3 cm. Maternal uterus/adnexae: No free fluid seen in the pelvis. Normal appearance of the bilateral ovaries. IMPRESSION: 1. Single viable intrauterine pregnancy with estimated gestational age of [redacted]  weeks 4 days. 2. Small subchorionic hemorrhage. Electronically Signed   By: Allegra Lai M.D.   On: 06/23/2022 16:37    MAU Management/MDM: Orders Placed This Encounter  Procedures   US OB Comp Less 14 Wks   CBC   hCG, quantitative, pregnancy   Pregnancy, urine POC   Discharge patient    No orders of the defined types were placed in this encounter.   Single IUP noted on today's [place]. Discussed findings with patient today.  Reviewed single pregnancy, no additional gestational sac visualized.  Discussed [facility] as incidental finding but pt to f/u  if she has bleeding.  Return precautions reviewed.    ASSESSMENT 1. Normal IUP (intrauterine pregnancy) on prenatal ultrasound, first trimester   2. Abdominal pain during pregnancy in first trimester   3. History of DVT (deep vein thrombosis)   4. [redacted] weeks gestation of pregnancy     PLAN Discharge home Allergies as of 06/23/2022       Reactions   Methylphenidate Itching   Penicillins Anaphylaxis   Did it involve swelling of the face/tongue/throat, SOB, or low BP? Yes Did it involve sudden or severe rash/hives, skin peeling, or any reaction on the inside of your mouth or nose? Yes Did you need to seek medical attention at a hospital or doctor's office? Yes When did it last happen?  In high school     If all above answers are "NO", may proceed with cephalosporin use.   Morphine Nausea And Vomiting   Concerta [methylphenidate Hcl] Itching   Dexmethylphenidate Itching   Hydrocodone Nausea And Vomiting   Lisdexamfetamine Palpitations   Other reaction(s): Delusions (intolerance)   Vyvanse [lisdexamfetamine Dimesylate] Palpitations        Medication List     TAKE these medications    milk thistle 175 MG tablet Take 175 mg by mouth daily.   Prenatal Vitamin/Min +DHA 27-0.8-200 MG Caps Take 1 tablet by mouth daily.   vitamin C 100 MG tablet Take 100 mg by mouth daily.        Follow-up Information     Prenatal provider of your  choice Follow up.   Why: See list provided        [facility] Follow up.   Specialty: Obstetrics and Gynecology Why: As needed for emergencies Contact information: [address] [phone] [address]he[place] Bonito [place] [address] [phone]                Sharen Counter Certified Nurse-Midwife 06/23/2022  6:54 PM

## 2023-07-31 ENCOUNTER — Other Ambulatory Visit (HOSPITAL_COMMUNITY): Payer: Self-pay

## 2023-12-05 ENCOUNTER — Ambulatory Visit: Admitting: Obstetrics and Gynecology

## 2023-12-05 DIAGNOSIS — N939 Abnormal uterine and vaginal bleeding, unspecified: Secondary | ICD-10-CM
